# Patient Record
Sex: Male | Born: 1950 | Race: White | Hispanic: No | Smoking: Former smoker
Health system: Southern US, Community
[De-identification: ages and names within clinical notes are randomized; demographics above are authoritative.]

## PROBLEM LIST (undated history)

## (undated) DIAGNOSIS — J45909 Unspecified asthma, uncomplicated: Secondary | ICD-10-CM

## (undated) HISTORY — DX: Unspecified asthma, uncomplicated: J45.909

---

## 1988-08-27 HISTORY — PX: KNEE SURGERY: SHX244

## 2007-04-25 ENCOUNTER — Ambulatory Visit: Payer: Self-pay

## 2011-08-28 HISTORY — PX: HERNIA REPAIR: SHX51

## 2011-09-18 ENCOUNTER — Ambulatory Visit: Payer: Self-pay | Admitting: Surgery

## 2011-09-18 HISTORY — PX: INGUINAL HERNIA REPAIR: SUR1180

## 2014-12-19 NOTE — Op Note (Signed)
PATIENT NAME:  Tyler Gregory, Tyler Gregory MR#:  [id number] DATE OF BIRTH:  Oct 21, 1950  DATE OF PROCEDURE:  09/18/2011  PREOPERATIVE DIAGNOSIS: Left inguinal hernia.   POSTOPERATIVE DIAGNOSIS: Left inguinal hernia.   PROCEDURE: Left inguinal hernia repair.   SURGEON: Loreli Dollar, MD  ASSISTANT: Britta Mccreedy, PA  ANESTHESIA: General.   INDICATIONS: This 64 year old male has recently developed bulging in the left groin. Has noticed it for about three years but recently found that there was some pain associated with the bulging and did have findings of left inguinal hernia on exam. Repair was recommended for definitive treatment.   DESCRIPTION OF PROCEDURE: The patient was placed on the operating table in the supine position under general anesthesia. The abdomen was prepared with ChloraPrep, draped in a sterile manner.   A left lower quadrant transversely oriented suprapubic incision was made and carried down through subcutaneous tissues. Several small bleeding points were cauterized. The Scarpa's fascia was incised. The external ring was identified. The external oblique aponeurosis was incised along the course of its fibers to open the external ring and expose the inguinal cord structures. The ilioinguinal nerve was seen coursing medially and retracted back away from the repair site. The floor of the inguinal canal appeared to be intact. The cremaster muscle fibers were spread to expose an indirect hernia sac which was dissected free from surrounding structures up well into the internal ring. The sac was opened. Its continuity with the peritoneal cavity was demonstrated. A high ligation of the sac was done with a 0 Surgilon suture ligature. The sac was excised and the stump was allowed to retract. Next cord lipoma which was some 2 inches in length was dissected free from surrounding structures up into the internal ring and ligated with 4-0 chromic suture ligature and amputated. Did not appear that pathological  examination was needed. Next, the floor of the inguinal canal was reinforced with a row of 0 Surgilon sutures suturing the conjoined tendon to the shelving edge of the inguinal ligament with the last stitch leading to satisfactory narrowing of the internal ring. Next, an onlay Atrium mesh was placed to reinforce this. The mesh was cut out to an oval shape of some 2.5 x 4 cm in dimension. A notch cut out to straddle the internal ring and this was sewn to the repair and also medially attached to the fascia with 0 Surgilon sutures. The repair looked good. Hemostasis was intact. Cord structures and ilioinguinal nerve were replaced along the floor of the inguinal canal. The cut edges of the external oblique aponeurosis were closed with a running 4-0 Vicryl. The deep fascia superior and lateral to the repair site was infiltrated with 0.5% Sensorcaine with epinephrine. Subcutaneous tissues were infiltrated as well. Scarpa's fascia was closed with interrupted 4-0 Vicryl. The skin was closed with a running 4-0 Monocryl subcuticular suture and Dermabond.   The patient tolerated the procedure satisfactorily and is now being prepared for transfer to the recovery room.  ____________________________ Lenna Sciara. Rochel Brome, MD jws:cms D: 09/18/2011 10:00:35 ET T: 09/18/2011 10:20:28 ET JOB#: [id number]  cc: Loreli Dollar, MD, <Dictator> Loreli Dollar MD ELECTRONICALLY SIGNED 09/30/2011 15:18

## 2017-12-09 ENCOUNTER — Encounter: Payer: Self-pay | Admitting: Family Medicine

## 2017-12-09 ENCOUNTER — Ambulatory Visit: Payer: Self-pay | Admitting: Family Medicine

## 2017-12-09 VITALS — BP 122/82 | HR 78 | Temp 98.2°F | Resp 16 | Ht 73.0 in | Wt 226.4 lb

## 2017-12-09 DIAGNOSIS — J069 Acute upper respiratory infection, unspecified: Secondary | ICD-10-CM

## 2017-12-09 MED ORDER — HYDROCODONE-HOMATROPINE 5-1.5 MG/5ML PO SYRP: 5.0000 mL | ORAL_SOLUTION | Freq: Four times a day (QID) | ORAL | 0 refills | Status: AC | PRN

## 2017-12-09 NOTE — Progress Notes (Signed)
Subjective:     Patient ID: Tyler Gregory, male   DOB: [birth year], 67 y.o.   MRN: [id number] Chief Complaint  Patient presents with  . Cough    Patient comes in office today with complaints of cough and nasal congestion for one week, patient reports that cough seems to be worse at night. Patient has tried otc Robitussin Severe cold and Flonase.    HPI States he was exposed to an employee that was ill. Reports he has clear sinus drainage with PND and accompanying cough.  Review of Systems     Objective:   Physical Exam  Constitutional: He appears well-developed and well-nourished. No distress.  Ears: T.M's intact without inflammation Throat: tonsils absent Neck: no cervical adenopathy Lungs: clear     Assessment:    1. Viral upper respiratory tract infection - HYDROcodone-homatropine (HYCODAN) 5-1.5 MG/5ML syrup; Take 5 mLs by mouth every 6 (six) hours as needed for up to 5 days. 5 ml 4-6 hours as needed for cough  Dispense: 100 mL; Refill: 0    Plan:    Discussed use of Mucinex D.

## 2017-12-09 NOTE — Patient Instructions (Addendum)
Discussed use of Mucinex D. Call me if not improving over the course of the week.

## 2018-08-05 ENCOUNTER — Encounter: Payer: Self-pay | Admitting: Family Medicine

## 2018-08-05 ENCOUNTER — Ambulatory Visit (INDEPENDENT_AMBULATORY_CARE_PROVIDER_SITE_OTHER): Payer: Medicare Other | Admitting: Family Medicine

## 2018-08-05 ENCOUNTER — Other Ambulatory Visit: Payer: Self-pay

## 2018-08-05 VITALS — BP 138/78 | HR 79 | Temp 98.3°F | Ht 73.0 in | Wt 230.4 lb

## 2018-08-05 DIAGNOSIS — J029 Acute pharyngitis, unspecified: Secondary | ICD-10-CM

## 2018-08-05 LAB — POCT RAPID STREP A (OFFICE): Rapid Strep A Screen: NEGATIVE

## 2018-08-05 NOTE — Patient Instructions (Addendum)
Discussed warm salt water gargles, two Aleve twice daily with food and Benadryl at bedtime. Add Delsym for cough as needed.

## 2018-08-05 NOTE — Progress Notes (Signed)
  Subjective:     Patient ID: Tyler Gregory, male   DOB: 1950-10-23, 67 y.o.   MRN: [id number] Chief Complaint  Patient presents with  . Sore Throat    during day not bad and at night very bad and makes it hard for him to sleep  . Nasal Congestion    for 3 days 08/02/18   HPI Describes mild cold sx with increased sore throat at night. Has taken left over abx of unknown type for his sx.  Review of Systems     Objective:   Physical Exam  Constitutional: He appears well-developed and well-nourished. No distress.  Ears: T.M's intact without inflammation Throat: tonsils absent; uvula and posterior pharynx are inflamed. Neck: no cervical adenopathy Lungs: clear     Assessment:    1. Pharyngitis, unspecified etiology - POCT rapid strep A    Plan:    Discussed use of salt water gargles, Aleve, and Benadryl at night. Delsym if needed for cough.

## 2018-08-21 ENCOUNTER — Other Ambulatory Visit: Payer: Self-pay

## 2018-08-21 ENCOUNTER — Telehealth: Payer: Self-pay | Admitting: Family Medicine

## 2018-08-21 ENCOUNTER — Encounter: Payer: Self-pay | Admitting: Family Medicine

## 2018-08-21 ENCOUNTER — Ambulatory Visit (INDEPENDENT_AMBULATORY_CARE_PROVIDER_SITE_OTHER): Payer: Medicare Other | Admitting: Family Medicine

## 2018-08-21 VITALS — BP 130/74 | HR 86 | Temp 99.0°F | Ht 72.0 in | Wt 229.0 lb

## 2018-08-21 DIAGNOSIS — J069 Acute upper respiratory infection, unspecified: Secondary | ICD-10-CM | POA: Diagnosis not present

## 2018-08-21 DIAGNOSIS — Z8709 Personal history of other diseases of the respiratory system: Secondary | ICD-10-CM

## 2018-08-21 MED ORDER — ALBUTEROL SULFATE HFA 108 (90 BASE) MCG/ACT IN AERS: 2.0000 | INHALATION_SPRAY | Freq: Four times a day (QID) | RESPIRATORY_TRACT | 1 refills | Status: DC | PRN

## 2018-08-21 NOTE — Telephone Encounter (Signed)
He needs to be seen if he feels he is having an asthma flare. I am willing to put him in at 3:40 or he can report to an Urgent Care.

## 2018-08-21 NOTE — Telephone Encounter (Signed)
Spoke to pt and he advised that he has asthma and it flares up sometimes.  He was given a inhaler by dennis several years ago and needs another rx for ventolin sent to pharmacy if possible.

## 2018-08-21 NOTE — Progress Notes (Signed)
  Subjective:     Patient ID: Tyler Gregory, male   DOB: [birth year], 67 y.o.   MRN: [id number] Chief Complaint  Patient presents with  . asthma flare    think it has came from the bug spray he was using two days ago 08/19/18   HPI Reports cold sx onset two days ago coincident with bug spray use. Reports childhood hx of asthma.  Review of Systems     Objective:   Physical Exam Constitutional:      General: He is not in acute distress.    Appearance: Normal appearance. He is not ill-appearing.   Ears: T.M's intact without inflammation Throat: no tonsillar enlargement or exudate Neck: no cervical adenopathy Lungs: clear     Assessment:    1. URI, acute  2. History of asthma: rx for albuterol MDI    Plan:    Discussed use of Mucinex D for congestion, Delsym for cough, and Benadryl for postnasal drainage

## 2018-08-21 NOTE — Patient Instructions (Signed)
Discussed use of Mucinex D for congestion, Delsym for cough, and Benadryl for postnasal drainage 

## 2018-08-21 NOTE — Telephone Encounter (Signed)
Scheduled pt for appt at 3:40pm per Mikki Santee

## 2018-08-21 NOTE — Telephone Encounter (Signed)
Pt called saying he's having a asthma attack.  Please call him back at [phone]  To let him know if the Ventolin Inhaler can be called into:     [id number] [address], [place] [phone] (Phone) [phone] (Fax)   Was asked to speak to a nurse - declined.  Thanks, [employer]

## 2019-06-04 ENCOUNTER — Encounter: Payer: Self-pay | Admitting: Family Medicine

## 2019-06-04 ENCOUNTER — Ambulatory Visit
Admission: RE | Admit: 2019-06-04 | Discharge: 2019-06-04 | Disposition: A | Discharge status: Home / Self Care | Payer: PPO | Source: Ambulatory Visit | Attending: Family Medicine | Admitting: Family Medicine

## 2019-06-04 ENCOUNTER — Ambulatory Visit (INDEPENDENT_AMBULATORY_CARE_PROVIDER_SITE_OTHER): Payer: PPO | Admitting: Family Medicine

## 2019-06-04 ENCOUNTER — Other Ambulatory Visit: Payer: Self-pay

## 2019-06-04 VITALS — BP 120/68 | HR 85 | Temp 96.9°F | Wt 228.0 lb

## 2019-06-04 DIAGNOSIS — G8929 Other chronic pain: Secondary | ICD-10-CM

## 2019-06-04 DIAGNOSIS — Z23 Encounter for immunization: Secondary | ICD-10-CM

## 2019-06-04 DIAGNOSIS — M25511 Pain in right shoulder: Secondary | ICD-10-CM

## 2019-06-04 DIAGNOSIS — L814 Other melanin hyperpigmentation: Secondary | ICD-10-CM

## 2019-06-04 NOTE — Progress Notes (Signed)
Tyler Gregory  MRN: [id number] DOB: 11/21/1950  Subjective:  HPI  The patient is a 68 year old male who presents for evaluation of his right sided should blade pain. He states that it has been hurting him for about 1.5 months.  He has had a history of this same pain last November.  It got better but has again started back.   He has taken Ibuprofen for the pain but states that it makes him too drowsy the next day.  Lying down he does not feel as bad.  Standing is the worst.    The patient also would like to have a spot on his clavicle checked to make sure it is not cancer.  There are no active problems to display for this patient.   Past Medical History:  Diagnosis Date  . Asthma    Past Surgical History:  Procedure Laterality Date  . HERNIA REPAIR  2013  . KNEE SURGERY  1990   No family history on file.  Social History   Socioeconomic History  . Marital status: Unknown    Spouse name: Not on file  . Number of children: Not on file  . Years of education: Not on file  . Highest education level: Not on file  Occupational History  . Not on file  Social Needs  . Financial resource strain: Not on file  . Food insecurity    Worry: Not on file    Inability: Not on file  . Transportation needs    Medical: Not on file    Non-medical: Not on file  Tobacco Use  . Smoking status: Former Smoker    Types: Cigarettes  . Smokeless tobacco: Never Used  Substance and Sexual Activity  . Alcohol use: Yes    Comment: occational  . Drug use: Not Currently  . Sexual activity: Not on file  Lifestyle  . Physical activity    Days per week: Not on file    Minutes per session: Not on file  . Stress: Not on file  Relationships  . Social Herbalist on phone: Not on file    Gets together: Not on file    Attends religious service: Not on file    Active member of club or organization: Not on file    Attends meetings of clubs or organizations: Not on file    Relationship  status: Not on file  . Intimate partner violence    Fear of current or ex partner: Not on file    Emotionally abused: Not on file    Physically abused: Not on file    Forced sexual activity: Not on file  Other Topics Concern  . Not on file  Social History Narrative  . Not on file    Outpatient Encounter Medications as of 06/04/2019  Medication Sig  . albuterol (PROVENTIL HFA;VENTOLIN HFA) 108 (90 Base) MCG/ACT inhaler Inhale 2 puffs into the lungs every 6 (six) hours as needed for wheezing or shortness of breath.   No facility-administered encounter medications on file as of 06/04/2019.    Allergies  Allergen Reactions  . Shrimp [Shellfish Allergy] Swelling   Review of Systems  Constitutional: Negative for chills, diaphoresis, fever and malaise/fatigue.  HENT: Negative for congestion, ear pain and sore throat.   Respiratory: Negative for cough and shortness of breath.   Cardiovascular: Negative for chest pain.  Gastrointestinal: Negative for abdominal pain and diarrhea.  Musculoskeletal: Positive for back pain.  Neurological: Negative for headaches.  Objective:  BP 120/68 (BP Location: Right Arm, Patient Position: Sitting, Cuff Size: Normal)   Pulse 85   Temp (!) 96.9 F (36.1 C) (Skin)   Wt 228 lb (103.4 kg)   SpO2 95%   BMI 30.92 kg/m   Physical Exam  Constitutional: He is oriented to person, place, and time and well-developed, well-nourished, and in no distress.  HENT:  Head: Normocephalic.  Eyes: Conjunctivae are normal.  Neck: Neck supple.  Cardiovascular: Normal rate.  Pulmonary/Chest: Effort normal and breath sounds normal.  Abdominal: Soft.  Musculoskeletal: Normal range of motion.        General: Tenderness present.     Comments: Slight tenderness along the medial border of the right scapula to deep palpation. Good UE strength bilaterally. No numbness or loss of ROM.  Neurological: He is alert and oriented to person, place, and time.  Skin: No rash  noted.  Psychiatric: Mood, affect and judgment normal.    Assessment and Plan :  1. 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 Feels Ibuprofen causes morning drowsiness and stretching exercises not much help in keeping this from returning each day. Recommend Salonpas Lidocaine patch or Icy-Hot with Lidocaine cream to the area prn. Will get x-ray evaluation of shoulder and schedule physical therapy. - DG Shoulder Right - Ambulatory referral to Physical Therapy  2. Need for influenza vaccination - Flu Vaccine QUAD High Dose(Fluad)  3. Age spots 1.5-2.0 cm light tan spot over the right upper chest near clavicle. No tenderness or local lymphadenopathy. Suspect age spot versus early seborrheic keratosis. Advised patient of benign nature of this lesion.

## 2019-06-15 ENCOUNTER — Ambulatory Visit: Payer: PPO | Admitting: Physical Therapy

## 2019-06-17 ENCOUNTER — Encounter: Payer: Self-pay | Admitting: Physical Therapy

## 2019-06-17 ENCOUNTER — Encounter: Payer: PPO | Admitting: Physical Therapy

## 2019-06-17 ENCOUNTER — Ambulatory Visit: Payer: PPO | Attending: Family Medicine | Admitting: Physical Therapy

## 2019-06-17 ENCOUNTER — Other Ambulatory Visit: Payer: Self-pay

## 2019-06-17 DIAGNOSIS — M5413 Radiculopathy, cervicothoracic region: Secondary | ICD-10-CM | POA: Diagnosis not present

## 2019-06-17 DIAGNOSIS — M542 Cervicalgia: Secondary | ICD-10-CM | POA: Insufficient documentation

## 2019-06-17 DIAGNOSIS — M25611 Stiffness of right shoulder, not elsewhere classified: Secondary | ICD-10-CM | POA: Diagnosis not present

## 2019-06-17 NOTE — Therapy (Signed)
[facility] [address], [place], [address] Phone: [phone]   Fax:  [phone]  Physical Therapy Evaluation  Patient Details  Name: Tyler Gregory MRN: [id number] Date of Birth: 1951/04/11 No data recorded  Encounter Date: 06/17/2019  PT End of Session - 06/17/19 0816    Visit Number  1    Number of Visits  17    Date for PT Re-Evaluation  08/13/19    PT Start Time  0800    PT Stop Time  0900    PT Time Calculation (min)  60 min    Activity Tolerance  Patient tolerated treatment well    Behavior During Therapy  [facility] for tasks assessed/performed       Past Medical History:  Diagnosis Date  . Asthma     Past Surgical History:  Procedure Laterality Date  . HERNIA REPAIR  2013  . KNEE SURGERY  1990    There were no vitals filed for this visit.      OBJECTIVE  MUSCULOSKELETAL: Tremor: Normal Bulk: Normal Tone: Normal  Cervical Screen AROM: Ext/flex WNL Rotation R: 59d L 64d  Lateral bending 30 bilat Spurlings A (ipsilateral lateral flexion/axial compression): R: Positive  L: Negative Spurlings B (ipsilateral lateral flexion/contralateral rotation/axial compression): R: Positive L: Negative Repeated movement: No centralization or peripheralization with protraction or retraction Hoffman Sign (cervical cord compression): Negative bilat ULTT Median: Negative bilat ULTT Ulnar: Negative bilat ULTT Radial: Negative bilat  Elbow Screen Elbow AROM: WNL  Palpation Trigger points with associated pain at R mid trap fibers, rhomboids and levator. Latent trigger points and tension noted at R latissimus/teres minor, bilat pec minor, cervical/upper tspine paraspinals  Posture: increased thoracic kyphosis, forward head, rounded shoulders  Upper tspine hypomobile  Strength R/L 5/5 Shoulder flexion (anterior deltoid/pec major/coracobrachialis, axillary n. (C5-6) and musculocutaneous n.  (C5-7)) 5/5 Shoulder abduction (deltoid/supraspinatus, axillary/suprascapular n, C5) 5/5 Shoulder external rotation (infraspinatus/teres minor) 5/5 Shoulder internal rotation (subcapularis/lats/pec major) 5/5 Shoulder extension (posterior deltoid, lats, teres major, axillary/thoracodorsal n.) 5/5 Shoulder horizontal abduction 5/5 Elbow flexion (biceps brachii, brachialis, brachioradialis, musculoskeletal n, C5-6) 5/5 Elbow extension (triceps, radial n, C7) 5/5 Wrist Extension 5/5 Wrist Flexion 5/5 Finger adduction (interossei, ulnar n, T1)  AROM R/L 180/180 Shoulder flexion 180/180 Shoulder abduction C8 bilat Shoulder external rotation L1/T12 Shoulder internal rotation 60/60 Shoulder extension *Indicates pain, overpressure performed unless otherwise indicated  PROM R/L 180/180 Shoulder flexion 180/180 Shoulder abduction 90/90 Shoulder external rotation 70/60* Shoulder internal rotation 60/60 Shoulder extension *Indicates pain, overpressure performed unless otherwise indicated  Accessory Motions/Glides Glenohumeral: Posterior: Normal bilat Inferior:Normal bilat Anterior: Not examined  Acromioclavicular:  Posterior: Normal bilat Anterior: Not tested  Sternoclavicular: Posterior: Normal bilat Superior: Normal bilat Inferior: Normal bilat  Scapulothoracic: Distraction: difficult on R side d/t muscle tension  Medial: Normal bilat Lateral:  difficult on R side d/t muscle tension  Inferior: limited bilat Superior: Normal bilat   NEUROLOGICAL:  Mental Status Patient is oriented to person, place and time.  Recent memory is intact.  Remote memory is intact.  Attention span and concentration are intact.  Expressive speech is intact.  Patient's fund of knowledge is within normal limits for educational level.  Cranial Nerves Visual acuity and visual fields are intact  Extraocular muscles are intact  Facial sensation is intact bilaterally  Facial strength is  intact bilaterally  Hearing is normal as tested by gross conversation Palate elevates midline, normal phonation  Shoulder shrug strength is  intact  Tongue protrudes midline   Sensation Grossly intact to light touch bilateral UE as determined by testing dermatomes C2-T2 Proprioception and hot/cold testing deferred on this date    SPECIAL TESTS  Rotator Cuff  Drop Arm Test: Negative  Painful Arc (Pain from 60 to 120 degrees scaption): Negative Infraspinatus Muscle Test: Negative If all 3 tests positive, the probability of a full-thickness rotator cuff tear is 91%  Subacromial Impingement Hawkins-Kennedy: Negative Neer (Block scapula, PROM flexion): Negative Painful Arc (Pain from 60 to 120 degrees scaption): Negative Empty Can: Reports some "different pain" External Rotation Resistance: Negative Horizontal Adduction: Negative Scapular Assist: Negative  Labral Tear Biceps Load II (120 elevation, full ER, 90 elbow flexion, full supination, resisted elbow flexion): Negative Crank (160 scaption, axial load with IR/ER): Negative Active Compression Test: Negative  Bicep Tendon Pathology Speed (shoulder flexion to 90, external rotation, full elbow extension, and forearm supination with resistance: Negative Yergason's (resisted shoulder ER and supination/biceps tendon pathology): Negative  Shoulder Instability Sulcus Sign: Negative Anterior Apprehension: Negative  Outcome Measures Quick DASH:    Ther-Ex Education on corrections for therex patient is already completing ie doorway pec stretch,  Education for 30-60sec hold in position Scapular retractions x10 with min cuing to prevent shoulder hiking, good carry over Cervical retraction + upper cervical flex x10 with use of hands for TC for technique with good carry over Patient reports retraction feels good, advised 10x/hour  Education on ergonomics at work with towel roll at [employer] for postural cuing, and neural desk  posure Education on heat, trigger point massage with ball, and TDN as a" temporary fix" and restoration of length/tension relationships for prolonged pain relief, and increased function/longevity of function Some trigger point release, which patient reports decreases pain                          Objective measurements completed on examination: See above findings.                PT Short Term Goals - 06/17/19 1040      PT SHORT TERM GOAL #1   Title  Pt will be independent with HEP in order to improve strength and decrease pain in order to improve pain-free function at home and work.    Baseline  06/17/19 HEP given    Time  4    Period  Weeks    Status  New        PT Long Term Goals - 06/17/19 1041      PT LONG TERM GOAL #1   Title  Pt will decrease quick DASH score by at least 8% in order to demonstrate clinically significant reduction in disability.    Baseline  06/17/19 25%    Time  8    Period  Weeks    Status  New      PT LONG TERM GOAL #2   Title  Pt will decrease worst pain as reported on NPRS by at least 3 points in order to demonstrate clinically significant reduction in pain.    Baseline  06/17/19    Time  8    Period  Weeks    Status  New      PT LONG TERM GOAL #3   Title  Pt will increase periscapular strength of  by at least 1/2 MMT grade in order to demonstrate improvement in strength and function    Baseline  06/17/19 Y L 4- R  3+ ;T 4 bilat ; I 4+ bilat    Time  8    Period  Weeks    Status  New      PT LONG TERM GOAL #4   Title  Patient will demonstrate neutral sitting posture without cuing with 100% accuracy for improved function at work    Baseline  06/17/19 thoracic kyphosis, rounded shoulders, forward head, needs mod VC and TC for correction, able to comply 75%    Time  8    Period  Weeks    Status  New             Plan - 06/17/19 1129    Clinical Impression Statement  Patient is a 68 year old male with  signs and symptoms consistent with R sided cervical rediculopathy. Impairments in sensation, thoracic hypomobility, postural deficits, periscapular weakness, and soft tissue tension. 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 Would benefit from skilled PT to address above deficits and promote optimal return to PLOF.    Personal Factors and Comorbidities  Age;Comorbidity 1;Fitness;Profession    Comorbidities  asthma    Examination-Activity Limitations  Sit;Lift;Carry;Reach Overhead    Examination-Participation Restrictions  Community Activity;Driving   desk work   Stability/Clinical Decision Making  Evolving/Moderate complexity    Clinical Decision Making  Moderate    Rehab Potential  Good    PT Frequency  2x / week    PT Duration  8 weeks    PT Treatment/Interventions  ADLs/Self Care Home Management;Iontophoresis 4mg /ml Dexamethasone;Electrical Stimulation;Functional mobility training;Neuromuscular re-education;Taping;Spinal Manipulations;Joint Manipulations;Vestibular;Passive range of motion;Dry needling;Ultrasound;Traction;Cryotherapy;Moist Heat;Therapeutic exercise;Therapeutic activities;Patient/family education;Manual techniques    PT Next Visit Plan  HEP review, TDN, postural restoration    PT Home Exercise Plan  scapular retractions, cervical retractions, pec stretch, seated thoracic ext    Consulted and Agree with Plan of Care  Patient       Patient will benefit from skilled therapeutic intervention in order to improve the following deficits and impairments:  Decreased endurance, Decreased mobility, Hypomobility, Increased muscle spasms, Impaired sensation, Decreased activity tolerance, Decreased strength, Increased fascial restricitons, Impaired flexibility, Impaired UE functional use, Postural dysfunction, Pain, Improper body mechanics, Impaired tone, Decreased range of  motion  Visit Diagnosis: Cervicalgia  Radiculopathy, cervicothoracic region  Stiffness of right shoulder, not elsewhere classified     Problem List There are no active problems to display for this patient.  Shelton Silvas PT, DPT Shelton Silvas 06/17/2019, 11:46 AM  [facility] [address] [place], [address] Phone: [phone]   Fax:  [phone]  Name: Tyler Gregory MRN: [id number] Date of Birth: December 11, 1950

## 2019-06-22 ENCOUNTER — Ambulatory Visit: Payer: PPO | Admitting: Physical Therapy

## 2019-06-24 ENCOUNTER — Encounter: Payer: Self-pay | Admitting: Physical Therapy

## 2019-06-24 ENCOUNTER — Other Ambulatory Visit: Payer: Self-pay

## 2019-06-24 ENCOUNTER — Ambulatory Visit: Payer: PPO | Admitting: Physical Therapy

## 2019-06-24 DIAGNOSIS — M25611 Stiffness of right shoulder, not elsewhere classified: Secondary | ICD-10-CM

## 2019-06-24 DIAGNOSIS — M5413 Radiculopathy, cervicothoracic region: Secondary | ICD-10-CM

## 2019-06-24 DIAGNOSIS — M542 Cervicalgia: Secondary | ICD-10-CM

## 2019-06-24 NOTE — Therapy (Signed)
[facility] [address], [place], [address] Phone: [phone]   Fax:  [phone]  Physical Therapy Treatment  Patient Details  Name: Tyler Gregory MRN: [id number] Date of Birth: 13-Jun-1951 No data recorded  Encounter Date: 06/24/2019  PT End of Session - 06/24/19 0907    Visit Number  2    Number of Visits  17    Date for PT Re-Evaluation  08/13/19    PT Start Time  0900    PT Stop Time  0945    PT Time Calculation (min)  45 min    Activity Tolerance  Patient tolerated treatment well    Behavior During Therapy  [facility] for tasks assessed/performed       Past Medical History:  Diagnosis Date  . Asthma     Past Surgical History:  Procedure Laterality Date  . HERNIA REPAIR  2013  . KNEE SURGERY  1990    There were no vitals filed for this visit.  Subjective Assessment - 06/24/19 0901    Subjective  Patient reports his neck pain is 20% better. Worse numbness and tingling later in the day. He is sleeping better at night which he is happy with. Reports 3/10 pain today.    Pertinent History  Pt is 68 year old male with acute on chronic R posterior shoulder pain. Reports this episode has been going on a month and half. 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 Patient reports pain is a pins and needles sensation from post scapula to middle of the forearm, that is worse at night and with continuous movement. Pain is aggravated by overhead motion, driving his work truck, and it is worse at night. 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 Worst pain over the past week 6/10, best 0/10 when he wakes up in the morning.  Pt denies N/V, B&B changes, unexplained weight fluctuation, saddle paresthesia,  fever, night sweats, or unrelenting night pain at this time.    Limitations  House hold activities;Lifting    How long can you sit comfortably?  unimited    How long can you stand comfortably?  unlimited    How long can you walk comfortably?  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 Patient was educated on risks and benefits of therapy and verbally consents to PT.   Ther-Ex - Thoracic ext over 1/2 foam with BUE flex on theraball x12 with breath control, cuing needed for this good carry over - Seated with 1/2 foam band pull aparts black TB 3x 10 with cuing for cervical neutral, and scap retraction with decent carry over - Supine chin tuck with 1in lift x10sec hold x5 with min cuing needed initially for proper technique with good carry over following - Review on cervical retraction +  upper cervical flexion with min correction needed, encouragement to complete 10x every hour - Prone Y 3x 10 with cuing needed initially for scapular retraction with good carry over - Standing rows 20# x10 25# 2x 10 with cuing initially for maintained neutral cervical spine with good carry over                   PT Education - 06/24/19 0904    Education Details  TDN, therex form    Person(s) Educated  Patient    Methods  Explanation;Demonstration;Verbal cues    Comprehension  Verbalized understanding;Returned demonstration;Verbal cues required       PT Short Term Goals - 06/17/19 1040      PT SHORT TERM GOAL #1   Title  Pt will be independent with HEP in order to improve strength and decrease pain in order to improve  pain-free function at home and work.    Baseline  06/17/19 HEP given    Time  4    Period  Weeks    Status  New        PT Long Term Goals - 06/17/19 1041      PT LONG TERM GOAL #1   Title  Pt will decrease quick DASH score by at least 8% in order to demonstrate clinically significant reduction in disability.    Baseline  06/17/19 25%    Time  8    Period  Weeks    Status  New      PT LONG TERM GOAL #2   Title  Pt will decrease worst pain as reported on NPRS by at least 3 points in order to demonstrate clinically significant reduction in pain.    Baseline  06/17/19    Time  8    Period  Weeks    Status  New      PT LONG TERM GOAL #3   Title  Pt will increase periscapular strength of  by at least 1/2 MMT grade in order to demonstrate improvement in strength and function    Baseline  06/17/19 Y L 4- R 3+ ;T 4 bilat ; I 4+ bilat    Time  8    Period  Weeks    Status  New      PT LONG TERM GOAL #4   Title  Patient will demonstrate neutral sitting posture without cuing with 100% accuracy for improved function at work    Baseline  06/17/19 thoracic kyphosis, rounded shoulders, forward head, needs mod VC and TC for correction, able to comply 75%    Time  8    Period  Weeks    Status  New              Patient will benefit from skilled therapeutic intervention in order to improve the following deficits and impairments:     Visit Diagnosis: Cervicalgia  Radiculopathy, cervicothoracic region  Stiffness of right shoulder, not elsewhere classified     Problem List There are no active problems to display for this patient.  Shelton Silvas PT, DPT Shelton Silvas 06/24/2019, 9:08 AM  [place] [facility] [facility] [address], [place], [address] Phone: [phone]   Fax:  [phone]  Name: Tyler Gregory MRN: [id number] Date of Birth: 04/22/1951

## 2019-06-29 ENCOUNTER — Ambulatory Visit: Payer: PPO | Attending: Family Medicine | Admitting: Physical Therapy

## 2019-06-29 DIAGNOSIS — M542 Cervicalgia: Secondary | ICD-10-CM | POA: Insufficient documentation

## 2019-06-29 DIAGNOSIS — M5413 Radiculopathy, cervicothoracic region: Secondary | ICD-10-CM | POA: Insufficient documentation

## 2019-06-29 DIAGNOSIS — M25611 Stiffness of right shoulder, not elsewhere classified: Secondary | ICD-10-CM | POA: Insufficient documentation

## 2019-07-01 ENCOUNTER — Ambulatory Visit: Payer: PPO | Admitting: Physical Therapy

## 2019-07-06 ENCOUNTER — Ambulatory Visit: Payer: PPO | Admitting: Physical Therapy

## 2019-07-08 ENCOUNTER — Ambulatory Visit: Payer: PPO | Admitting: Physical Therapy

## 2019-07-13 ENCOUNTER — Ambulatory Visit: Payer: PPO | Admitting: Physical Therapy

## 2019-07-15 ENCOUNTER — Other Ambulatory Visit: Payer: Self-pay

## 2019-07-15 ENCOUNTER — Encounter: Payer: Self-pay | Admitting: Physical Therapy

## 2019-07-15 ENCOUNTER — Encounter: Payer: PPO | Admitting: Physical Therapy

## 2019-07-15 ENCOUNTER — Ambulatory Visit: Payer: PPO | Admitting: Physical Therapy

## 2019-07-15 DIAGNOSIS — M5413 Radiculopathy, cervicothoracic region: Secondary | ICD-10-CM

## 2019-07-15 DIAGNOSIS — M542 Cervicalgia: Secondary | ICD-10-CM

## 2019-07-15 DIAGNOSIS — M25611 Stiffness of right shoulder, not elsewhere classified: Secondary | ICD-10-CM | POA: Diagnosis not present

## 2019-07-15 NOTE — Therapy (Signed)
[place] [facility] [address] [place], [address] Phone: [phone]   Fax:  [phone]  Physical Therapy Treatment  Patient Details  Name: Tyler Gregory MRN: [id number] Date of Birth: Aug 05, 1951 No data recorded  Encounter Date: 07/15/2019  PT End of Session - 07/15/19 0849    Visit Number  3    Number of Visits  17    Date for PT Re-Evaluation  08/13/19    PT Start Time  0815    PT Stop Time  0900    PT Time Calculation (min)  45 min    Activity Tolerance  Patient tolerated treatment well    Behavior During Therapy  [facility] for tasks assessed/performed       Past Medical History:  Diagnosis Date  . Asthma     Past Surgical History:  Procedure Laterality Date  . HERNIA REPAIR  2013  . KNEE SURGERY  1990    There were no vitals filed for this visit.  Subjective Assessment - 07/15/19 0819    Subjective  Patient returns to PT after 3 week absence d/t having trouble at work. 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 Reports this episode has been going on a month and half. 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 Patient reports pain is a pins and needles sensation from post scapula to middle of the forearm, that is worse at night and with continuous movement. Pain is aggravated by overhead motion, driving his work truck, and it is worse at night. 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 Worst pain over the past week 6/10, best 0/10 when he wakes up in the morning.  Pt  denies N/V, B&B changes, unexplained weight fluctuation, saddle paresthesia, fever, night sweats, or unrelenting night pain at this time.    Limitations  House hold activities;Lifting    How long can you sit comfortably?  unimited    How long can you stand comfortably?  unlimited    How long can you walk comfortably?  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 Patient was educated on risks and benefits of therapy and verbally consents to PT.   Ther-Ex - Thoracic ext over 1/2 foam with BUE pec stretch (hands behind head) x12 with breath control, cuing needed for this good carry over - Cervical retractions x10 with scap retractions x10 with TC at back of head for true retraction w/ upper cervical flex with good carry over - Levator stretch 2x 30sec hold  - Standing rows 25# 3x 10 with cuing for neutral  cervical posture with difficulty maintaining this, adopting forward head posture with breaks between sets - Lat pulldown 25# x10 with TC and demo initially with good carry over following - Theraball rollouts (modified childs post) 10x 10sec                    PT Education - 07/15/19 0848    Education Details  HEP review, therx form    Person(s) Educated  Patient    Methods  Explanation;Demonstration;Tactile cues;Verbal cues    Comprehension  Verbalized understanding;Returned demonstration;Verbal cues required;Tactile cues required       PT Short Term Goals - 06/17/19 1040      PT SHORT TERM GOAL #1   Title  Pt will be independent with HEP in order to improve strength and decrease pain in order to  improve pain-free function at home and work.    Baseline  06/17/19 HEP given    Time  4    Period  Weeks    Status  New        PT Long Term Goals - 06/17/19 1041      PT LONG TERM GOAL #1   Title  Pt will decrease quick DASH score by at least 8% in order to demonstrate clinically significant reduction in disability.    Baseline  06/17/19 25%    Time  8    Period  Weeks    Status  New      PT LONG TERM GOAL #2   Title  Pt will decrease worst pain as reported on NPRS by at least 3 points in order to demonstrate clinically significant reduction in pain.    Baseline  06/17/19    Time  8    Period  Weeks    Status  New      PT LONG TERM GOAL #3   Title  Pt will increase periscapular strength of  by at least 1/2 MMT grade in order to demonstrate improvement in strength and function    Baseline  06/17/19 Y L 4- R 3+ ;T 4 bilat ; I 4+ bilat    Time  8    Period  Weeks    Status  New      PT LONG TERM GOAL #4   Title  Patient will demonstrate neutral sitting posture without cuing with 100% accuracy for improved function at work    Baseline  06/17/19 thoracic kyphosis, rounded shoulders, forward head, needs mod VC and TC for correction, able to comply 75%    Time  8    Period  Weeks    Status  New            Plan - 07/15/19 [id number]    Clinical Impression Statement  PT continued utilization of manual and TDN to reduce muscle tension and pain with good success. PT reviewed HEP with some updates given, good demo and verbalized understanding. PT led patient through therex for postural strengthening with good success, cuing needed with good compliance. Reports 2/10 pain at the end of session    Personal Factors and Comorbidities  Age;Comorbidity 1;Fitness;Profession    Comorbidities  asthma    Examination-Activity Limitations  Sit;Lift;Carry;Reach Overhead    Examination-Participation Restrictions  Community Activity;Driving    Stability/Clinical Decision Making  Evolving/Moderate  complexity    Clinical Decision Making  Moderate    Rehab Potential  Good    PT Frequency  2x / week    PT Duration  8 weeks  PT Treatment/Interventions  ADLs/Self Care Home Management;Iontophoresis 4mg /ml Dexamethasone;Electrical Stimulation;Functional mobility training;Neuromuscular re-education;Taping;Spinal Manipulations;Joint Manipulations;Vestibular;Passive range of motion;Dry needling;Ultrasound;Traction;Cryotherapy;Moist Heat;Therapeutic exercise;Therapeutic activities;Patient/family education;Manual techniques    PT Next Visit Plan  TDN, postural restoration    PT Home Exercise Plan  scapular retractions, cervical retractions, pec stretch, seated thoracic ext    Consulted and Agree with Plan of Care  Patient       Patient will benefit from skilled therapeutic intervention in order to improve the following deficits and impairments:  Decreased endurance, Decreased mobility, Hypomobility, Increased muscle spasms, Impaired sensation, Decreased activity tolerance, Decreased strength, Increased fascial restricitons, Impaired flexibility, Impaired UE functional use, Postural dysfunction, Pain, Improper body mechanics, Impaired tone, Decreased range of motion  Visit Diagnosis: Cervicalgia  Radiculopathy, cervicothoracic region  Stiffness of right shoulder, not elsewhere classified     Problem List There are no active problems to display for this patient.  Shelton Silvas PT, DPT Shelton Silvas 07/15/2019, 9:03 AM  [place] [facility] [address] [place], [address] Phone: [phone]   Fax:  [phone]  Name: Tyler Gregory MRN: [id number] Date of Birth: [birth year]

## 2019-07-20 ENCOUNTER — Ambulatory Visit: Payer: PPO | Admitting: Physical Therapy

## 2019-07-20 ENCOUNTER — Encounter: Payer: PPO | Admitting: Physical Therapy

## 2019-07-22 ENCOUNTER — Encounter: Payer: PPO | Admitting: Physical Therapy

## 2019-07-22 ENCOUNTER — Ambulatory Visit: Payer: PPO | Admitting: Physical Therapy

## 2019-07-22 ENCOUNTER — Other Ambulatory Visit: Payer: Self-pay

## 2019-07-22 ENCOUNTER — Encounter: Payer: Self-pay | Admitting: Physical Therapy

## 2019-07-22 DIAGNOSIS — M542 Cervicalgia: Secondary | ICD-10-CM

## 2019-07-22 DIAGNOSIS — M25611 Stiffness of right shoulder, not elsewhere classified: Secondary | ICD-10-CM

## 2019-07-22 DIAGNOSIS — M5413 Radiculopathy, cervicothoracic region: Secondary | ICD-10-CM

## 2019-07-22 NOTE — Therapy (Signed)
[place] [facility] [address] [place], [address] Phone: [phone]   Fax:  [phone]  Physical Therapy Treatment  Patient Details  Name: Tyler Gregory MRN: [id number] Date of Birth: 07-21-1951 No data recorded  Encounter Date: 07/22/2019  PT End of Session - 07/22/19 0830    Visit Number  4    Number of Visits  17    Date for PT Re-Evaluation  08/13/19    PT Start Time  0800    PT Stop Time  0812    PT Time Calculation (min)  12 min    Activity Tolerance  Patient tolerated treatment well    Behavior During Therapy  [facility] for tasks assessed/performed       Past Medical History:  Diagnosis Date  . Asthma     Past Surgical History:  Procedure Laterality Date  . HERNIA REPAIR  2013  . KNEE SURGERY  1990    There were no vitals filed for this visit.    Manual STM withtrigger point releaseto R mid trap fibers, rhomboids, and levator, with concordant sign Following:Dry Needling: (3)63mm .30needles placed along the R mid trap/rhomboids/levatorto decrease increased muscular spasms and trigger points with the patient positioned in supine. Patient was educated on risks and benefits of therapy and verbally consents to PT.   Verbal HEP review through TDN/manual with patient verbalizing understanding and importance of continuing.                           PT Education - 07/22/19 0811    Education Details  HEP review, TDN    Person(s) Educated  Patient    Methods  Explanation    Comprehension  Verbalized understanding       PT Short Term Goals - 06/17/19 1040      PT SHORT TERM GOAL #1   Title  Pt will be independent with HEP in order to improve strength and decrease pain in order to improve pain-free function at home and work.    Baseline  06/17/19 HEP given    Time  4    Period  Weeks    Status  New        PT Long Term Goals - 06/17/19 1041      PT LONG TERM GOAL  #1   Title  Pt will decrease quick DASH score by at least 8% in order to demonstrate clinically significant reduction in disability.    Baseline  06/17/19 25%    Time  8    Period  Weeks    Status  New      PT LONG TERM GOAL #2   Title  Pt will decrease worst pain as reported on NPRS by at least 3 points in order to demonstrate clinically significant reduction in pain.    Baseline  06/17/19    Time  8    Period  Weeks    Status  New      PT LONG TERM GOAL #3   Title  Pt will increase periscapular strength of  by at least 1/2 MMT grade in order to demonstrate improvement in strength and function    Baseline  06/17/19 Y L 4- R 3+ ;T 4 bilat ; I 4+ bilat    Time  8    Period  Weeks    Status  New      PT LONG TERM GOAL #4  Title  Patient will demonstrate neutral sitting posture without cuing with 100% accuracy for improved function at work    Baseline  06/17/19 thoracic kyphosis, rounded shoulders, forward head, needs mod VC and TC for correction, able to comply 75%    Time  8    Period  Weeks    Status  New            Plan - 07/22/19 0830    Clinical Impression Statement  Patient reports he has to leave session d/t work issues. PT performed manual with TDN techniques to relieve soft tissue tension with good success, and reviewed importance of HEP, especially after prolonged absence from PT d/t the holiday. PT will continue progression as able.    Personal Factors and Comorbidities  Age;Comorbidity 1;Fitness;Profession    Comorbidities  asthma    Examination-Activity Limitations  Sit;Lift;Carry;Reach Overhead    Examination-Participation Restrictions  Community Activity;Driving    Stability/Clinical Decision Making  Evolving/Moderate complexity    Clinical Decision Making  Moderate    Rehab Potential  Good    PT Frequency  2x / week    PT Duration  8 weeks    PT Treatment/Interventions  ADLs/Self Care Home Management;Iontophoresis 4mg /ml Dexamethasone;Electrical  Stimulation;Functional mobility training;Neuromuscular re-education;Taping;Spinal Manipulations;Joint Manipulations;Vestibular;Passive range of motion;Dry needling;Ultrasound;Traction;Cryotherapy;Moist Heat;Therapeutic exercise;Therapeutic activities;Patient/family education;Manual techniques    PT Next Visit Plan  TDN, postural restoration    PT Home Exercise Plan  scapular retractions, cervical retractions, pec stretch, seated thoracic ext    Consulted and Agree with Plan of Care  Patient       Patient will benefit from skilled therapeutic intervention in order to improve the following deficits and impairments:  Decreased endurance, Decreased mobility, Hypomobility, Increased muscle spasms, Impaired sensation, Decreased activity tolerance, Decreased strength, Increased fascial restricitons, Impaired flexibility, Impaired UE functional use, Postural dysfunction, Pain, Improper body mechanics, Impaired tone, Decreased range of motion  Visit Diagnosis: Cervicalgia  Radiculopathy, cervicothoracic region  Stiffness of right shoulder, not elsewhere classified     Problem List There are no active problems to display for this patient.  Shelton Silvas PT, DPT Shelton Silvas 07/22/2019, 8:36 AM  [place] [facility] [address], [place], [address] Phone: [phone]   Fax:  [phone]  Name: Tyler Gregory MRN: [id number] Date of Birth: 09/21/1950

## 2019-07-27 ENCOUNTER — Encounter: Payer: PPO | Admitting: Physical Therapy

## 2019-07-27 ENCOUNTER — Ambulatory Visit: Payer: PPO | Admitting: Physical Therapy

## 2019-07-29 ENCOUNTER — Other Ambulatory Visit: Payer: Self-pay

## 2019-07-29 ENCOUNTER — Encounter: Payer: PPO | Admitting: Physical Therapy

## 2019-07-29 ENCOUNTER — Ambulatory Visit: Payer: PPO | Attending: Family Medicine | Admitting: Physical Therapy

## 2019-07-29 ENCOUNTER — Encounter: Payer: Self-pay | Admitting: Physical Therapy

## 2019-07-29 DIAGNOSIS — M542 Cervicalgia: Secondary | ICD-10-CM | POA: Diagnosis not present

## 2019-07-29 DIAGNOSIS — M25611 Stiffness of right shoulder, not elsewhere classified: Secondary | ICD-10-CM | POA: Diagnosis not present

## 2019-07-29 DIAGNOSIS — M5413 Radiculopathy, cervicothoracic region: Secondary | ICD-10-CM | POA: Diagnosis not present

## 2019-07-29 NOTE — Therapy (Signed)
[facility] [address] [place], [address] Phone: [phone]   Fax:  [phone]  Physical Therapy Treatment  Patient Details  Name: Tyler Gregory MRN: [id number] Date of Birth: [birth year] No data recorded  Encounter Date: 07/29/2019  PT End of Session - 07/29/19 0902    Visit Number  5    Number of Visits  17    Date for PT Re-Evaluation  08/13/19    PT Start Time  0847    PT Stop Time  0930    PT Time Calculation (min)  43 min    Activity Tolerance  Patient tolerated treatment well    Behavior During Therapy  [facility] for tasks assessed/performed       Past Medical History:  Diagnosis Date  . Asthma     Past Surgical History:  Procedure Laterality Date  . HERNIA REPAIR  2013  . KNEE SURGERY  1990    There were no vitals filed for this visit.  Subjective Assessment - 07/29/19 0852    Subjective  Reports he felt better after TDN last session, reports 3/10 pain today. Reports he wants to start going to the gym to work on his abdominal and back strength    Pertinent History  Pt is 67 year old male with acute on chronic R posterior shoulder pain. Reports this episode has been going on a month and half. 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 Patient reports pain is a pins and needles sensation from post scapula to middle of the forearm, that is worse at night and with continuous movement. Pain is aggravated by overhead motion, driving his work truck, and it is worse at night. 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 Worst pain over the past week 6/10, best 0/10 when he wakes up in the morning.  Pt denies N/V, B&B changes, unexplained weight fluctuation, saddle paresthesia, fever, night  sweats, or unrelenting night pain at this time.    Limitations  House hold activities;Lifting    How long can you sit comfortably?  unimited    How long can you stand comfortably?  unlimited    How long can you walk comfortably?  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 Patient was educated on risks and benefits of therapy and verbally consents to PT.   Ther-Ex - Standing rows 25# 3x 10 with better carry over cervical neutral, continues to need cuing - Bent over row 20# 3x 10 with cuing initially for posture with good carry over  - Hip hinge x10 with demo and max cuing initially for proper technique with good carry over following - Straight leg deadlift 3x 10 10# DB bilat with min cuing for posture with good carry over - Lat pulldown 25# x10 with TC and demo initially with good carry over following - Theraball rollouts (modified  childs post) 10x 10sec                          PT Education - 07/29/19 0900    Education Details  TDN, therex form    Person(s) Educated  Patient    Methods  Explanation;Demonstration;Tactile cues;Verbal cues    Comprehension  Verbalized understanding;Returned demonstration;Verbal cues required;Tactile cues required       PT Short Term Goals - 06/17/19 1040      PT SHORT TERM GOAL #1   Title  Pt will be independent with HEP in order to improve strength and decrease pain in order to improve pain-free function at home and work.    Baseline  06/17/19 HEP given    Time  4    Period  Weeks    Status  New        PT Long Term Goals - 06/17/19 1041      PT  LONG TERM GOAL #1   Title  Pt will decrease quick DASH score by at least 8% in order to demonstrate clinically significant reduction in disability.    Baseline  06/17/19 25%    Time  8    Period  Weeks    Status  New      PT LONG TERM GOAL #2   Title  Pt will decrease worst pain as reported on NPRS by at least 3 points in order to demonstrate clinically significant reduction in pain.    Baseline  06/17/19    Time  8    Period  Weeks    Status  New      PT LONG TERM GOAL #3   Title  Pt will increase periscapular strength of  by at least 1/2 MMT grade in order to demonstrate improvement in strength and function    Baseline  06/17/19 Y L 4- R 3+ ;T 4 bilat ; I 4+ bilat    Time  8    Period  Weeks    Status  New      PT LONG TERM GOAL #4   Title  Patient will demonstrate neutral sitting posture without cuing with 100% accuracy for improved function at work    Baseline  06/17/19 thoracic kyphosis, rounded shoulders, forward head, needs mod VC and TC for correction, able to comply 75%    Time  8    Period  Weeks    Status  New            Plan - 07/29/19 [id number]    Clinical Impression Statement  PT continued therex progression for increased trunk/cervical stability with good success. Patient is able to complete all therex with good carry over of cuing/correction, without pain. PT continued utilization of manual techniques with good pain response (no pain following) allowing for therex progression.    Personal Factors and Comorbidities  Age;Comorbidity 1;Fitness;Profession    Examination-Activity Limitations  Sit;Lift;Carry;Reach Overhead    Examination-Participation Restrictions  Community Activity;Driving    Stability/Clinical Decision Making  Evolving/Moderate complexity    Clinical Decision Making  Moderate    Rehab Potential  Good    PT Frequency  2x / week    PT Duration  8 weeks    PT Treatment/Interventions  ADLs/Self Care Home Management;Iontophoresis 4mg /ml  Dexamethasone;Electrical Stimulation;Functional mobility training;Neuromuscular re-education;Taping;Spinal Manipulations;Joint Manipulations;Vestibular;Passive range of motion;Dry needling;Ultrasound;Traction;Cryotherapy;Moist Heat;Therapeutic exercise;Therapeutic activities;Patient/family education;Manual techniques    PT Next Visit Plan  TDN, postural restoration    PT Home  Exercise Plan  scapular retractions, cervical retractions, pec stretch, seated thoracic ext    Consulted and Agree with Plan of Care  Patient       Patient will benefit from skilled therapeutic intervention in order to improve the following deficits and impairments:  Decreased endurance, Decreased mobility, Hypomobility, Increased muscle spasms, Impaired sensation, Decreased activity tolerance, Decreased strength, Increased fascial restricitons, Impaired flexibility, Impaired UE functional use, Postural dysfunction, Pain, Improper body mechanics, Impaired tone, Decreased range of motion  Visit Diagnosis: Cervicalgia  Radiculopathy, cervicothoracic region  Stiffness of right shoulder, not elsewhere classified     Problem List There are no active problems to display for this patient.  Shelton Silvas PT, DPT Shelton Silvas 07/29/2019, 9:23 AM  [facility] [address], [place], [address] Phone: [phone]   Fax:  [phone]  Name: Tyler Gregory MRN: [id number] Date of Birth: 1951/07/13

## 2019-08-03 ENCOUNTER — Encounter: Payer: PPO | Admitting: Physical Therapy

## 2019-08-05 ENCOUNTER — Encounter: Payer: PPO | Admitting: Physical Therapy

## 2019-08-06 ENCOUNTER — Encounter: Payer: Self-pay | Admitting: Physical Therapy

## 2019-08-06 ENCOUNTER — Ambulatory Visit: Payer: PPO | Admitting: Physical Therapy

## 2019-08-06 ENCOUNTER — Other Ambulatory Visit: Payer: Self-pay

## 2019-08-06 DIAGNOSIS — M25611 Stiffness of right shoulder, not elsewhere classified: Secondary | ICD-10-CM

## 2019-08-06 DIAGNOSIS — M5413 Radiculopathy, cervicothoracic region: Secondary | ICD-10-CM

## 2019-08-06 DIAGNOSIS — M542 Cervicalgia: Secondary | ICD-10-CM

## 2019-08-06 NOTE — Therapy (Signed)
[facility] [address], [place], [address] Phone: [phone]   Fax:  [phone]  Physical Therapy Treatment  Patient Details  Name: Tyler Gregory MRN: [id number] Date of Birth: [birth year] No data recorded  Encounter Date: 08/06/2019  PT End of Session - 08/06/19 [id number]    Visit Number  6    Number of Visits  17    Date for PT Re-Evaluation  08/13/19    PT Start Time  0904    PT Stop Time  0917    PT Time Calculation (min)  13 min    Activity Tolerance  Patient tolerated treatment well    Behavior During Therapy  [facility] for tasks assessed/performed       Past Medical History:  Diagnosis Date  . Asthma     Past Surgical History:  Procedure Laterality Date  . HERNIA REPAIR  2013  . KNEE SURGERY  1990    There were no vitals filed for this visit.  Subjective Assessment - 08/06/19 0905    Subjective  Patient reports he is on a time crunch this am and only has time for manual techniques. Patient reports the exercises are helping, but his pain is 3/10 d/t haivng to work more d/t holiday season    Pertinent History  Pt is 68 year old male with acute on chronic R posterior shoulder pain. Reports this episode has been going on a month and half. 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 Patient reports pain is a pins and needles sensation from post scapula to middle of the forearm, that is worse at night and with continuous movement. Pain is aggravated by overhead motion, driving his work truck, and it is worse at night. 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 Worst pain over the past week 6/10, best 0/10 when he wakes up in the morning.  Pt denies N/V, B&B changes, unexplained weight  fluctuation, saddle paresthesia, fever, night sweats, or unrelenting night pain at this time.    Limitations  House hold activities;Lifting    How long can you sit comfortably?  unimited    How long can you stand comfortably?  unlimited    How long can you walk comfortably?  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 Patient was educated on risks and benefits of therapy and verbally consents to PT.                           PT Education - 08/06/19 [id number]    Education Details  TDN    Person(s) Educated  Patient    Methods  Explanation    Comprehension  Verbalized understanding       PT Short Term Goals - 06/17/19 1040      PT SHORT TERM GOAL #1   Title  Pt will be independent with HEP in order to improve strength and decrease pain in order to improve pain-free function at home and work.    Baseline  06/17/19 HEP given    Time  4    Period  Weeks    Status  New        PT Long Term Goals - 06/17/19 1041      PT LONG TERM GOAL #1   Title  Pt will decrease quick DASH score by at least 8% in order to demonstrate clinically significant reduction in disability.    Baseline  06/17/19 25%    Time  8    Period  Weeks    Status  New      PT LONG TERM GOAL #2   Title  Pt will decrease worst pain as reported on NPRS by at least 3 points in order to demonstrate clinically significant reduction in pain.    Baseline  06/17/19    Time  8    Period  Weeks    Status  New      PT LONG TERM GOAL #3   Title  Pt will increase periscapular strength of  by at least 1/2 MMT grade in order to demonstrate  improvement in strength and function    Baseline  06/17/19 Y L 4- R 3+ ;T 4 bilat ; I 4+ bilat    Time  8    Period  Weeks    Status  New      PT LONG TERM GOAL #4   Title  Patient will demonstrate neutral sitting posture without cuing with 100% accuracy for improved function at work    Baseline  06/17/19 thoracic kyphosis, rounded shoulders, forward head, needs mod VC and TC for correction, able to comply 75%    Time  8    Period  Weeks    Status  New            Plan - 08/06/19 0930    Clinical Impression Statement  PT utilized manual and TDN for pain modulation and to decrease muscle tension with good response, local twitch response. Session shortened d/t patient time constraints, though patient reports he can follow up his HEP on his own to maintain gains made. PT will continue progression as able, with postural strengthening as patient schedule allows.    Personal Factors and Comorbidities  Age;Comorbidity 1;Fitness;Profession    Comorbidities  asthma    Examination-Activity Limitations  Sit;Lift;Carry;Reach Overhead    Examination-Participation Restrictions  Community Activity;Driving    Stability/Clinical Decision Making  Evolving/Moderate complexity    Clinical Decision Making  Moderate    Rehab Potential  Good    PT Frequency  2x / week    PT Duration  8 weeks    PT Treatment/Interventions  ADLs/Self Care Home Management;Iontophoresis 4mg /ml Dexamethasone;Electrical Stimulation;Functional mobility training;Neuromuscular re-education;Taping;Spinal Manipulations;Joint Manipulations;Vestibular;Passive range of motion;Dry needling;Ultrasound;Traction;Cryotherapy;Moist Heat;Therapeutic exercise;Therapeutic activities;Patient/family education;Manual techniques    PT Next Visit Plan  TDN, postural restoration    PT Home Exercise Plan  scapular retractions, cervical retractions, pec stretch, seated thoracic ext    Consulted and Agree with Plan of Care  Patient       Patient will  benefit from skilled therapeutic intervention in order to improve the following deficits and impairments:  Decreased endurance, Decreased mobility, Hypomobility, Increased muscle spasms, Impaired sensation, Decreased activity tolerance, Decreased strength, Increased fascial restricitons, Impaired flexibility, Impaired UE functional use, Postural dysfunction, Pain, Improper body mechanics, Impaired tone, Decreased  range of motion  Visit Diagnosis: Cervicalgia  Radiculopathy, cervicothoracic region  Stiffness of right shoulder, not elsewhere classified     Problem List There are no problems to display for this patient.  Shelton Silvas PT, DPT Shelton Silvas 08/06/2019, 9:43 AM  [facility] [address], [place], [address] Phone: [phone]   Fax:  [phone]  Name: Tyler Gregory MRN: [id number] Date of Birth: 11/10/1950

## 2019-08-10 ENCOUNTER — Ambulatory Visit: Payer: PPO | Admitting: Physical Therapy

## 2019-08-12 ENCOUNTER — Ambulatory Visit: Payer: PPO | Admitting: Physical Therapy

## 2019-08-17 ENCOUNTER — Ambulatory Visit: Payer: PPO | Admitting: Physical Therapy

## 2019-08-18 ENCOUNTER — Encounter: Payer: PPO | Admitting: Physical Therapy

## 2019-08-19 ENCOUNTER — Ambulatory Visit: Payer: PPO | Admitting: Physical Therapy

## 2019-08-24 ENCOUNTER — Ambulatory Visit: Payer: PPO | Admitting: Physical Therapy

## 2019-08-26 ENCOUNTER — Encounter: Payer: PPO | Admitting: Physical Therapy

## 2019-12-14 NOTE — Progress Notes (Signed)
Established patient visit    Patient: Tyler Gregory   DOB: [birth year]   70 y.o. Male  MRN: [id number] Visit Date: 12/17/2019  Today's healthcare provider: Vernie Murders, PA   Chief Complaint  Patient presents with  . Abdominal Pain   Subjective    HPI Abdominal Pain  He reports new onset abdominal pain. The most recent episode started a few days ago and is gradually improving. The abdominal pain is located in the right upper quadrant and left lower quadrant and does not radiate. It is described as sharp, is mild in intensity, occurring intermittently. It is aggravated by nothing and is relieved by eating. He has tried nothing to try to relieve pain with no relief.  Associated symptoms: No anorexia   No belching   No bloody stool  No blood in urine  No constipation  No diarrhea No dysuria No fever No flatus No headaches No joint pains No myalgias No nausea No vomiting No weight loss    Previous labs No results found for: WBC, HGB, HCT, MCV, MCH, RDW, PLT No results found for: GLUCOSE, NA, K, CL, CO2, BUN, CREATININE, GFRNONAA, GFRAA, CALCIUM, PHOS, PROT, ALBUMIN, LABGLOB, AGRATIO, BILITOT, ALKPHOS, AST, ALT, ANIONGAP No results found for: AMYLASE -----------------------------------------------------------------------------------------   Past Medical History:  Diagnosis Date  . Asthma    Past Surgical History:  Procedure Laterality Date  . HERNIA REPAIR  2013  . KNEE SURGERY  1990   Social History   Socioeconomic History  . Marital status: Unknown    Spouse name: Not on file  . Number of children: Not on file  . Years of education: Not on file  . Highest education level: Not on file  Occupational History  . Not on file  Tobacco Use  . Smoking status: Former Smoker    Types: Cigarettes  . Smokeless tobacco: Never Used  Substance and Sexual Activity  . Alcohol use: Yes    Comment: occational  . Drug use: Not Currently  . Sexual activity: Not on  file  Other Topics Concern  . Not on file  Social History Narrative  . Not on file   Social Determinants of Health   Financial Resource Strain:   . Difficulty of Paying Living Expenses:   Food Insecurity:   . Worried About Charity fundraiser in the Last Year:   . Arboriculturist in the Last Year:   Transportation Needs:   . Film/video editor (Medical):   Marland Kitchen Lack of Transportation (Non-Medical):   Physical Activity:   . Days of Exercise per Week:   . Minutes of Exercise per Session:   Stress:   . Feeling of Stress :   Social Connections:   . Frequency of Communication with Friends and Family:   . Frequency of Social Gatherings with Friends and Family:   . Attends Religious Services:   . Active Member of Clubs or Organizations:   . Attends Archivist Meetings:   Marland Kitchen Marital Status:   Intimate Partner Violence:   . Fear of Current or Ex-Partner:   . Emotionally Abused:   Marland Kitchen Physically Abused:   . Sexually Abused:    History reviewed. No pertinent family history. Allergies  Allergen Reactions  . Shrimp [Shellfish Allergy] Swelling       Medications: Outpatient Medications Prior to Visit  Medication Sig  . albuterol (PROVENTIL HFA;VENTOLIN HFA) 108 (90 Base) MCG/ACT inhaler Inhale 2 puffs into the lungs every 6 (six)  hours as needed for wheezing or shortness of breath.   No facility-administered medications prior to visit.    Review of Systems  Constitutional: Positive for fatigue.  HENT: Positive for congestion.   Respiratory: Negative.   Cardiovascular: Negative.   Gastrointestinal: Positive for abdominal pain.  Musculoskeletal: Negative.        Objective    BP 130/82 (BP Location: Right Arm, Patient Position: Sitting, Cuff Size: Large)   Pulse 93   Temp (!) 97.3 F (36.3 C) (Temporal)   Ht 6' (1.829 m)   Wt 227 lb (103 kg)   BMI 30.79 kg/m    Physical Exam Constitutional:      Appearance: He is well-developed and normal weight.    HENT:     Head: Normocephalic and atraumatic.     Mouth/Throat:     Comments: Throat irritation  Eyes:     Pupils: Pupils are equal, round, and reactive to light.  Cardiovascular:     Rate and Rhythm: Normal rate and regular rhythm.     Pulses: Normal pulses.     Heart sounds: Normal heart sounds.  Pulmonary:     Effort: Pulmonary effort is normal.     Breath sounds: Normal breath sounds.  Abdominal:     General: Bowel sounds are normal. There is no distension.     Palpations: Abdomen is soft.     Tenderness: There is no abdominal tenderness. There is no right CVA tenderness or left CVA tenderness.  Musculoskeletal:        General: Normal range of motion.  Skin:    General: Skin is warm and dry.  Neurological:     General: No focal deficit present.     Mental Status: He is alert and oriented to person, place, and time.  Psychiatric:        Mood and Affect: Mood normal.        Behavior: Behavior normal.        Thought Content: Thought content normal.        Judgment: Judgment normal.        Assessment & Plan    1. Abdominal pain, unspecified abdominal location Have labs done. Will call with results. Developed a short lived abdominal pain in the RUQ and LLQ 5 days ago. No association with food. No gas, diarrhea, hematochezia, constipation, dyspepsia, nausea or vomiting. States he had episodes of heavy ET)H intake in the past but none recently.  No pain today and eating normally.   - CBC with Differential - Comprehensive Metabolic Panel (CMET) - TSH - Lipid Profile - Lipase - Amylase   Return if symptoms worsen or fail to improve.      Andres Shad, PA, have reviewed all documentation for this visit. The documentation on 12/17/19 for the exam, diagnosis, procedures, and orders are all accurate and complete.    Vernie Murders, [place] [phone] (phone) [phone] (fax)  [place]

## 2019-12-17 ENCOUNTER — Encounter: Payer: Self-pay | Admitting: Family Medicine

## 2019-12-17 ENCOUNTER — Other Ambulatory Visit: Payer: Self-pay

## 2019-12-17 ENCOUNTER — Ambulatory Visit (INDEPENDENT_AMBULATORY_CARE_PROVIDER_SITE_OTHER): Payer: PPO | Admitting: Family Medicine

## 2019-12-17 VITALS — BP 130/82 | HR 93 | Temp 97.3°F | Ht 72.0 in | Wt 227.0 lb

## 2019-12-17 DIAGNOSIS — R109 Unspecified abdominal pain: Secondary | ICD-10-CM

## 2019-12-18 ENCOUNTER — Telehealth: Payer: Self-pay | Admitting: *Deleted

## 2019-12-18 LAB — CBC WITH DIFFERENTIAL/PLATELET
Basophils Absolute: 0 10*3/uL (ref 0.0–0.2)
Basos: 0 %
EOS (ABSOLUTE): 0.2 10*3/uL (ref 0.0–0.4)
Eos: 3 %
Hematocrit: 44.8 % (ref 37.5–51.0)
Hemoglobin: 15.9 g/dL (ref 13.0–17.7)
Immature Grans (Abs): 0 10*3/uL (ref 0.0–0.1)
Immature Granulocytes: 0 %
Lymphocytes Absolute: 1.4 10*3/uL (ref 0.7–3.1)
Lymphs: 23 %
MCH: 31.2 pg (ref 26.6–33.0)
MCHC: 35.5 g/dL (ref 31.5–35.7)
MCV: 88 fL (ref 79–97)
Monocytes Absolute: 0.5 10*3/uL (ref 0.1–0.9)
Monocytes: 8 %
Neutrophils Absolute: 4.2 10*3/uL (ref 1.4–7.0)
Neutrophils: 66 %
Platelets: 200 10*3/uL (ref 150–450)
RBC: 5.1 x10E6/uL (ref 4.14–5.80)
RDW: 12.7 % (ref 11.6–15.4)
WBC: 6.3 10*3/uL (ref 3.4–10.8)

## 2019-12-18 LAB — COMPREHENSIVE METABOLIC PANEL
ALT: 30 IU/L (ref 0–44)
AST: 22 IU/L (ref 0–40)
Albumin/Globulin Ratio: 1.8 (ref 1.2–2.2)
Albumin: 4.6 g/dL (ref 3.8–4.8)
Alkaline Phosphatase: 59 IU/L (ref 39–117)
BUN/Creatinine Ratio: 17 (ref 10–24)
BUN: 15 mg/dL (ref 8–27)
Bilirubin Total: 0.5 mg/dL (ref 0.0–1.2)
CO2: 23 mmol/L (ref 20–29)
Calcium: 9.5 mg/dL (ref 8.6–10.2)
Chloride: 103 mmol/L (ref 96–106)
Creatinine, Ser: 0.89 mg/dL (ref 0.76–1.27)
GFR calc Af Amer: 102 mL/min/{1.73_m2} (ref >59)
GFR calc non Af Amer: 88 mL/min/{1.73_m2} (ref >59)
Globulin, Total: 2.5 g/dL (ref 1.5–4.5)
Glucose: 133 mg/dL — ABNORMAL HIGH (ref 65–99)
Potassium: 4 mmol/L (ref 3.5–5.2)
Sodium: 140 mmol/L (ref 134–144)
Total Protein: 7.1 g/dL (ref 6.0–8.5)

## 2019-12-18 LAB — LIPID PANEL
Chol/HDL Ratio: 4.3 ratio (ref 0.0–5.0)
Cholesterol, Total: 173 mg/dL (ref 100–199)
HDL: 40 mg/dL (ref >39)
LDL Chol Calc (NIH): 76 mg/dL (ref 0–99)
Triglycerides: 355 mg/dL — ABNORMAL HIGH (ref 0–149)
VLDL Cholesterol Cal: 57 mg/dL — ABNORMAL HIGH (ref 5–40)

## 2019-12-18 LAB — TSH: TSH: 2.06 u[IU]/mL (ref 0.450–4.500)

## 2019-12-18 LAB — AMYLASE: Amylase: 94 U/L (ref 31–110)

## 2019-12-18 LAB — LIPASE: Lipase: 62 U/L (ref 13–78)

## 2019-12-18 NOTE — Telephone Encounter (Signed)
Patient notified of lab results and PCP recommendations. He will come back in 3 months for recheck.

## 2019-12-18 NOTE — Telephone Encounter (Signed)
Tried calling pt, no answer and no vm. Patient needs in office follow up in 3 months. Okay for [facility] triage to give patient results.

## 2019-12-18 NOTE — Telephone Encounter (Signed)
-----   Message from Margo Common, [place] sent at 12/18/2019  8:55 AM EDT ----- All blood tests essentially normal except triglycerides and glucose high for fasting levels. Lower fats and sugars in diet should help this along with regular exercise (walking 30-40 minutes 3-4 days a week is a good way to start). Recheck levels in 3 months to be sure no developing disease.

## 2020-02-18 IMAGING — CR DG SHOULDER 2+V*R*
1 series · 3 of 3 positions shown · non-contrast
Comparison: None.

CLINICAL DATA: Chronic right shoulder pain without known injury.

EXAM:
RIGHT SHOULDER - 2+ VIEW

[Series 1: dg shoulder right · 0.14mm/px · 3 of 3 slices shown]
[im 1/3]
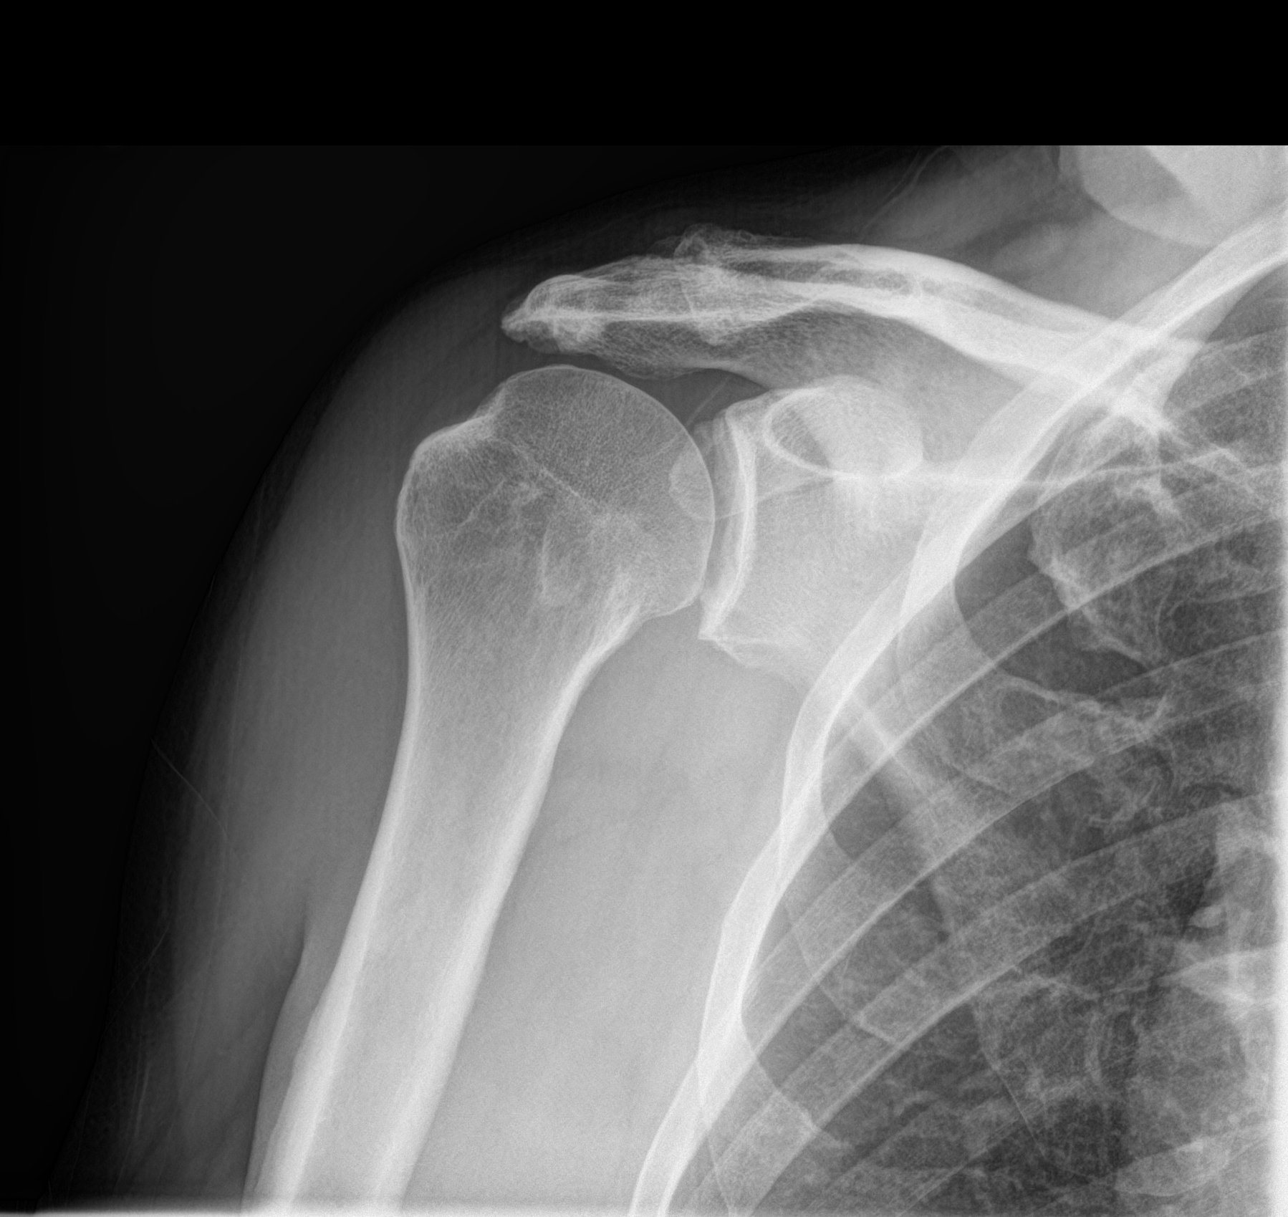
[im 2/3]
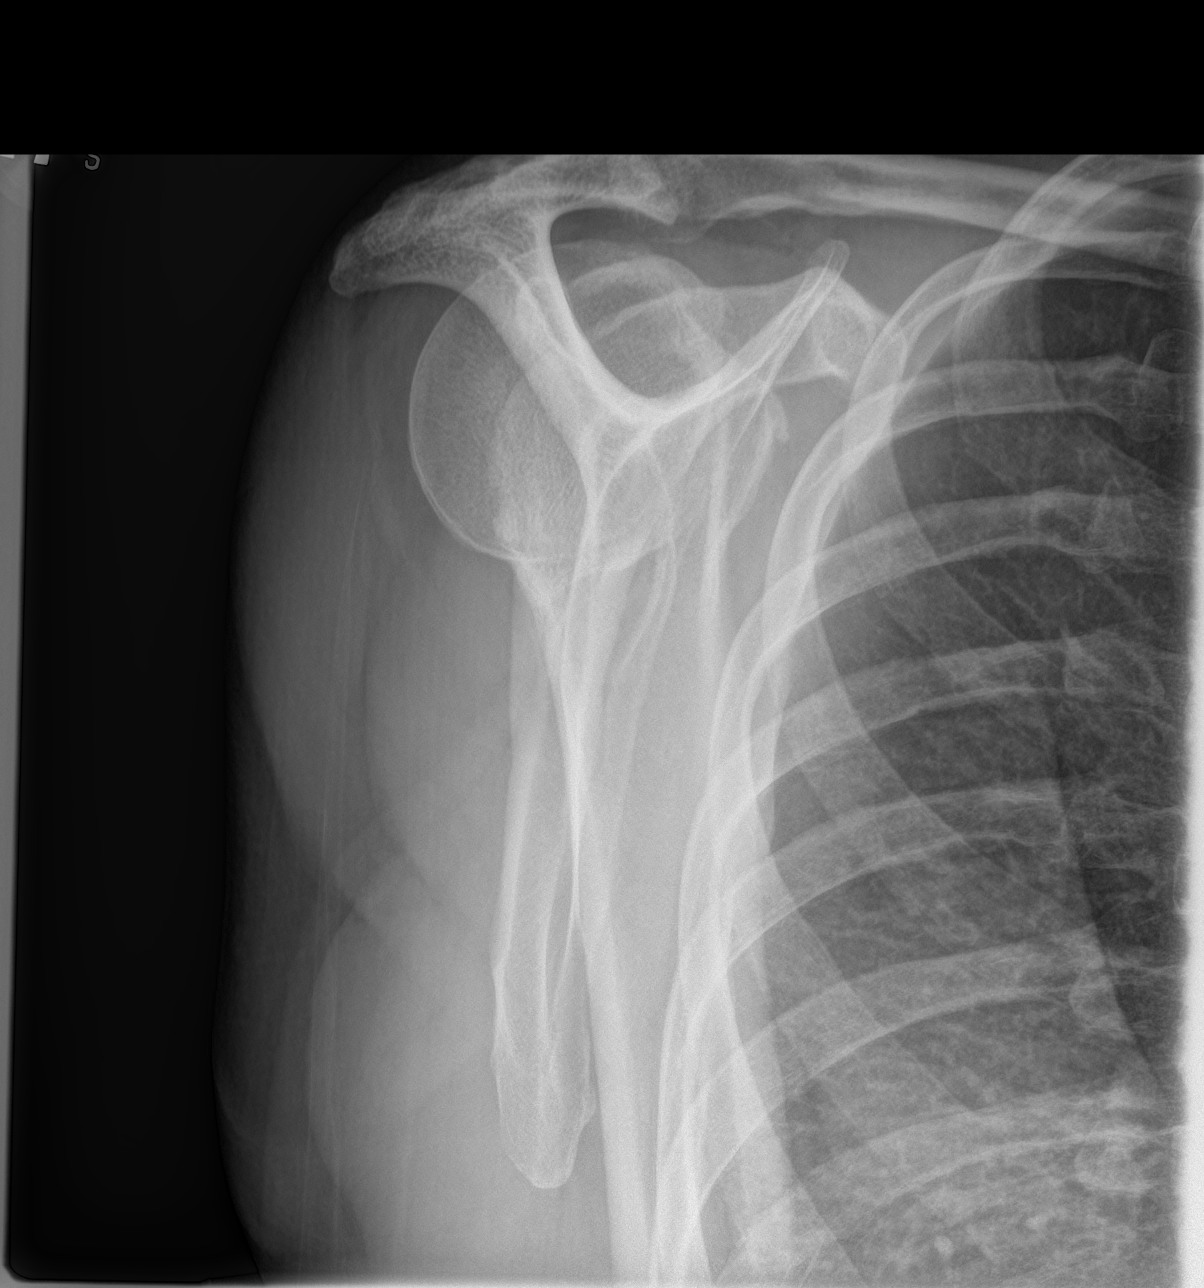
[im 3/3]
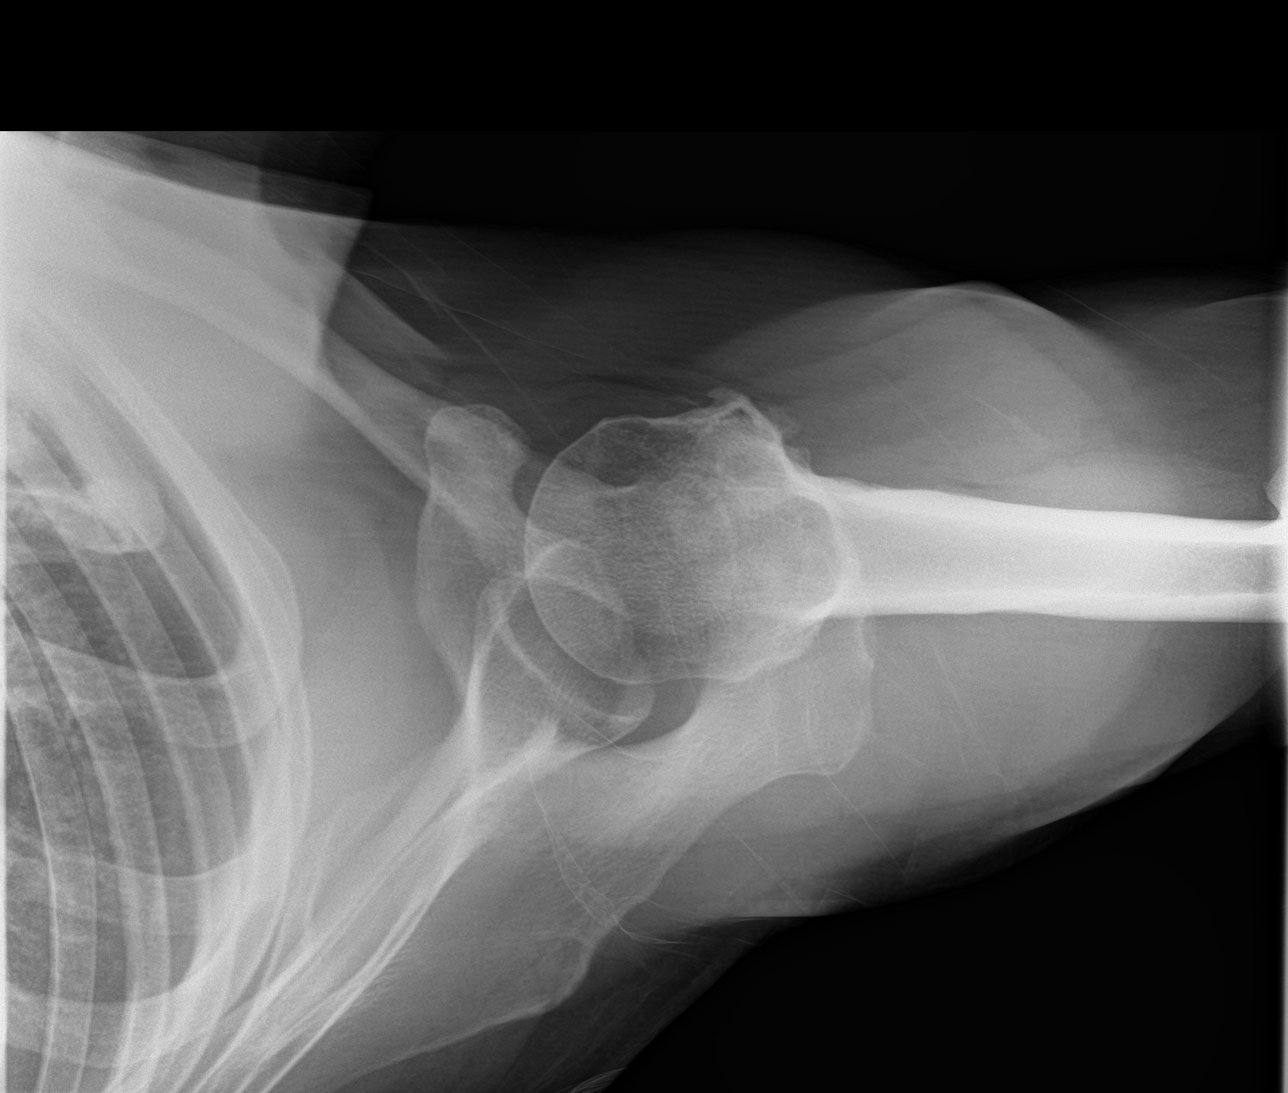

[3 of 3 positions shown; findings below may reference images not displayed]

FINDINGS: There is no evidence of fracture or dislocation. Mild degenerative
changes seen involving the right acromioclavicular joint. Soft
tissues are unremarkable.
IMPRESSION: Mild degenerative joint disease of the right acromioclavicular
joint. No acute abnormality seen in the right shoulder.

## 2020-03-18 ENCOUNTER — Ambulatory Visit: Payer: Self-pay | Admitting: Family Medicine

## 2020-05-05 ENCOUNTER — Ambulatory Visit: Payer: Self-pay | Admitting: Family Medicine

## 2020-05-30 ENCOUNTER — Other Ambulatory Visit: Payer: Self-pay | Admitting: Family Medicine

## 2020-05-30 MED ORDER — ALBUTEROL SULFATE HFA 108 (90 BASE) MCG/ACT IN AERS: 2.0000 | INHALATION_SPRAY | Freq: Four times a day (QID) | RESPIRATORY_TRACT | 3 refills | Status: AC | PRN

## 2020-05-30 NOTE — Telephone Encounter (Signed)
[facility] faxed refill request for the following medications:  albuterol (PROVENTIL HFA;VENTOLIN HFA) 108 (90 Base) MCG/ACT inhaler   Please advise. Thanks, [employer]

## 2020-06-09 ENCOUNTER — Telehealth: Payer: Self-pay

## 2020-06-09 NOTE — Telephone Encounter (Signed)
Patient Is asking for another referral to PT for his pinched nerve in back/neck

## 2020-06-10 NOTE — Telephone Encounter (Signed)
Schedule follow up appointment since no follow up physical exam in the past 6 months.

## 2020-06-10 NOTE — Telephone Encounter (Signed)
Please advise 

## 2020-06-14 ENCOUNTER — Ambulatory Visit (INDEPENDENT_AMBULATORY_CARE_PROVIDER_SITE_OTHER): Payer: PPO | Admitting: Family Medicine

## 2020-06-14 ENCOUNTER — Encounter: Payer: Self-pay | Admitting: Family Medicine

## 2020-06-14 ENCOUNTER — Other Ambulatory Visit: Payer: Self-pay

## 2020-06-14 VITALS — BP 124/69 | HR 73 | Temp 98.6°F | Wt 225.0 lb

## 2020-06-14 DIAGNOSIS — G8929 Other chronic pain: Secondary | ICD-10-CM

## 2020-06-14 DIAGNOSIS — Z23 Encounter for immunization: Secondary | ICD-10-CM

## 2020-06-14 DIAGNOSIS — M25511 Pain in right shoulder: Secondary | ICD-10-CM

## 2020-06-14 NOTE — Progress Notes (Signed)
Acute Office Visit  Subjective:    Patient ID: Tyler Gregory, male    DOB: 06/30/1951, 69 y.o.   MRN: [id number]  No chief complaint on file.   HPI Patient is in today requesting referral to Physical Therapy for neck/shoulder pain.  He has history of this and has had PT in the past. He states he has been doing his exercises for it at home but does not seem to be getting improvement.    Past Medical History:  Diagnosis Date  . Asthma     Past Surgical History:  Procedure Laterality Date  . HERNIA REPAIR  2013  . KNEE SURGERY  1990    No family history on file.  Social History   Socioeconomic History  . Marital status: Unknown    Spouse name: Not on file  . Number of children: Not on file  . Years of education: Not on file  . Highest education level: Not on file  Occupational History  . Not on file  Tobacco Use  . Smoking status: Former Smoker    Types: Cigarettes  . Smokeless tobacco: Never Used  Substance and Sexual Activity  . Alcohol use: Yes    Comment: OCCASIONALLY   . Drug use: Not Currently  . Sexual activity: Not on file  Other Topics Concern  . Not on file  Social History Narrative  . Not on file   Social Determinants of Health   Financial Resource Strain:   . Difficulty of Paying Living Expenses: Not on file  Food Insecurity:   . Worried About Charity fundraiser in the Last Year: Not on file  . Ran Out of Food in the Last Year: Not on file  Transportation Needs:   . Lack of Transportation (Medical): Not on file  . Lack of Transportation (Non-Medical): Not on file  Physical Activity:   . Days of Exercise per Week: Not on file  . Minutes of Exercise per Session: Not on file  Stress:   . Feeling of Stress : Not on file  Social Connections:   . Frequency of Communication with Friends and Family: Not on file  . Frequency of Social Gatherings with Friends and Family: Not on file  . Attends Religious Services: Not on file  . Active Member of  Clubs or Organizations: Not on file  . Attends Archivist Meetings: Not on file  . Marital Status: Not on file  Intimate Partner Violence:   . Fear of Current or Ex-Partner: Not on file  . Emotionally Abused: Not on file  . Physically Abused: Not on file  . Sexually Abused: Not on file    Outpatient Medications Prior to Visit  Medication Sig Dispense Refill  . albuterol (VENTOLIN HFA) 108 (90 Base) MCG/ACT inhaler Inhale 2 puffs into the lungs every 6 (six) hours as needed for wheezing or shortness of breath. 18 g 3   No facility-administered medications prior to visit.    Allergies  Allergen Reactions  . Shrimp [Shellfish Allergy] Swelling    Review of Systems  Musculoskeletal: Positive for arthralgias, myalgias and neck pain.       Objective:    Physical Exam Constitutional:      General: He is not in acute distress.    Appearance: He is well-developed.  HENT:     Head: Normocephalic and atraumatic.     Right Ear: Hearing normal.     Left Ear: Hearing normal.     Nose: Nose  normal.  Eyes:     General: Lids are normal. No scleral icterus.       Right eye: No discharge.        Left eye: No discharge.     Conjunctiva/sclera: Conjunctivae normal.  Pulmonary:     Effort: Pulmonary effort is normal. No respiratory distress.  Musculoskeletal:        General: Tenderness present. Normal range of motion.     Comments: Recurrence of right posterior shoulder pain along the medial border of the scapula. FROM in upper extremities with full strength bilaterally.  Skin:    Findings: No lesion or rash.  Neurological:     Mental Status: He is alert and oriented to person, place, and time.     Deep Tendon Reflexes: Reflexes normal.  Psychiatric:        Speech: Speech normal.        Behavior: Behavior normal.        Thought Content: Thought content normal.     BP 124/69 (BP Location: Right Arm, Patient Position: Sitting, Cuff Size: Normal)   Pulse 73   Temp 98.6  F (37 C) (Oral)   Wt 225 lb (102.1 kg)   SpO2 94%   BMI 30.52 kg/m  Wt Readings from Last 3 Encounters:  06/14/20 225 lb (102.1 kg)  12/17/19 227 lb (103 kg)  06/04/19 228 lb (103.4 kg)    Health Maintenance Due  Topic Date Due  . Hepatitis C Screening  Never done  . COVID-19 Vaccine (1) Never done  . TETANUS/TDAP  Never done  . COLONOSCOPY  Never done  . PNA vac Low Risk Adult (1 of 2 - PCV13) Never done  . INFLUENZA VACCINE  03/27/2020    There are no preventive care reminders to display for this patient.   Lab Results  Component Value Date   TSH 2.060 12/17/2019   Lab Results  Component Value Date   WBC 6.3 12/17/2019   HGB 15.9 12/17/2019   HCT 44.8 12/17/2019   MCV 88 12/17/2019   PLT 200 12/17/2019   Lab Results  Component Value Date   NA 140 12/17/2019   K 4.0 12/17/2019   CO2 23 12/17/2019   GLUCOSE 133 (H) 12/17/2019   BUN 15 12/17/2019   CREATININE 0.89 12/17/2019   BILITOT 0.5 12/17/2019   ALKPHOS 59 12/17/2019   AST 22 12/17/2019   ALT 30 12/17/2019   PROT 7.1 12/17/2019   ALBUMIN 4.6 12/17/2019   CALCIUM 9.5 12/17/2019   Lab Results  Component Value Date   CHOL 173 12/17/2019   Lab Results  Component Value Date   HDL 40 12/17/2019   Lab Results  Component Value Date   LDLCALC 76 12/17/2019   Lab Results  Component Value Date   TRIG 355 (H) 12/17/2019   Lab Results  Component Value Date   CHOLHDL 4.3 12/17/2019   No results found for: HGBA1C     Assessment & Plan:   1. Chronic right shoulder pain Recurrent right posterior shoulder pain over the past several weeks that he associates with strenuous activities. Similar episodes last Fall that responded well to PT and would like to go back. Advised he may use Salonpas patches and scheduled for PT evaluation and treatment. - Ambulatory referral to Physical Therapy  2. Need for influenza vaccination - Flu Vaccine QUAD High Dose(Fluad)    No orders of the defined types were  placed in this encounter.    Juluis Mire,  CMA

## 2020-06-30 ENCOUNTER — Telehealth: Payer: Self-pay | Admitting: Family Medicine

## 2020-06-30 NOTE — Telephone Encounter (Signed)
Copied from [place] [phone]. Topic: Medicare AWV >> Jun 30, 2020  2:15 PM Cher Nakai R wrote: Reason for CRM:   No answer unable to leave message for patient to call back and schedule Medicare Annual Wellness Visit (AWV) either virtually or in office.  No hx of AWV  - eligible as of 07/27/2018 awvi  Please schedule at anytime with [facility] Health Advisor.

## 2020-07-07 NOTE — Progress Notes (Addendum)
Subjective:   Tyler Gregory is a 69 y.o. male who presents for an Initial Medicare Annual Wellness Visit.  I connected with Tyler Gregory today by telephone and verified that I am speaking with the correct person using two identifiers. Location patient: home Location provider: work Persons participating in the virtual visit: patient, provider.   I discussed the limitations, risks, security and privacy concerns of performing an evaluation and management service by telephone and the availability of in person appointments. I also discussed with the patient that there may be a patient responsible charge related to this service. The patient expressed understanding and verbally consented to this telephonic visit.    Interactive audio and video telecommunications were attempted between this provider and patient, however failed, due to patient having technical difficulties OR patient did not have access to video capability.  We continued and completed visit with audio only.   Review of Systems    N/A  Cardiac Risk Factors include: advanced age (>49men, >38 women);male gender;sedentary lifestyle     Objective:    Today's Vitals   07/11/20 0815  PainSc: 8    There is no height or weight on file to calculate BMI.  Advanced Directives 07/11/2020 06/17/2019  Does Patient Have a Medical Advance Directive? No No  Would patient like information on creating a medical advance directive? No - Patient declined No - Patient declined    Current Medications (verified) Outpatient Encounter Medications as of 07/11/2020  Medication Sig   acetaminophen (TYLENOL) 325 MG tablet Take 650 mg by mouth every 6 (six) hours as needed.   albuterol (VENTOLIN HFA) 108 (90 Base) MCG/ACT inhaler Inhale 2 puffs into the lungs every 6 (six) hours as needed for wheezing or shortness of breath.   fluticasone (FLONASE) 50 MCG/ACT nasal spray Place 1 spray into both nostrils daily. As needed   No facility-administered  encounter medications on file as of 07/11/2020.    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  Tobacco Use   Smoking status: Former Smoker    Types: Cigarettes   Smokeless tobacco: Never Used   Tobacco comment: on and off; quit around 2000  Vaping Use   Vaping Use: Never used  Substance and Sexual Activity   Alcohol use: Yes    Alcohol/week: 12.0 standard drinks    Types: 12 Cans of beer per week    Comment: 0-2 beers a day   Drug use: Not Currently   Sexual activity: Not on file  Other Topics Concern   Not on file  Social History Narrative   Not on file   Social Determinants of Health   Financial Resource Strain: Low Risk    Difficulty of Paying Living Expenses: Not hard at all  Food Insecurity: No Food Insecurity   Worried About Charity fundraiser  in the Last Year: Never true   [place] in the Last Year: Never true  Transportation Needs: No Transportation Needs   Lack of Transportation (Medical): No   Lack of Transportation (Non-Medical): No  Physical Activity: Insufficiently Active   Days of Exercise per Week: 3 days   Minutes of Exercise per Session: 40 min  Stress: Stress Concern Present   Feeling of Stress : Rather much  Social Connections: Socially Isolated   Frequency of Communication with Friends and Family: More than three times a week   Frequency of Social Gatherings with  Friends and Family: More than three times a week   Attends Religious Services: Never   Marine scientist or Organizations: No   Attends Music therapist: Never   Marital Status: Divorced    Tobacco Counseling Counseling given: Not Answered Comment: on and off; quit around 2000   Clinical Intake:  Pre-visit preparation completed: Yes  Pain : 0-10 Pain Score: 8  Pain Type: Chronic pain Pain Location: Shoulder Pain Orientation: Right Pain Descriptors / Indicators: Aching Pain Frequency: Intermittent Pain Relieving Factors: Takes Tylenol as needed for pain.  Pain Relieving Factors: Takes Tylenol as needed for pain.  Nutritional Risks: None Diabetes: No  How often do you need to have someone help you when you read instructions, pamphlets, or other written materials from your doctor or pharmacy?: 1 - Never  Diabetic? No  Interpreter Needed?: No  Information entered by :: [facility], LPN   Activities of Daily Living In your present state of health, do you have any difficulty performing the following activities: 07/11/2020 12/17/2019  Hearing? N N  Vision? N N  Difficulty concentrating or making decisions? N N  Walking or climbing stairs? N N  Dressing or bathing? N N  Doing errands, shopping? N N  Preparing Food and eating ? N -  Using the Toilet? N -  In the past six months, have you accidently leaked urine? N -  Do you have problems with loss of bowel control? N -  Managing your Medications? N -  Managing your Finances? N -  Housekeeping or managing your Housekeeping? N -  Some recent data might be hidden    Patient Care Team: Chrismon, Vickki Muff, PA as PCP - General (Family Medicine) Arelia Sneddon, OD (Optometry)  Indicate any recent Medical Services you may have received from other than [facility] providers in the past year (date may be approximate).     Assessment:   This is a routine wellness examination for Tyler Gregory.  Hearing/Vision  screen No exam data present  Dietary issues and exercise activities discussed: Current Exercise Habits: Home exercise routine;The patient has a physically strenuous job, but has no regular exercise apart from work., Type of exercise: strength training/weights;walking;stretching, Time (Minutes): 45, Frequency (Times/Week): 3, Weekly Exercise (Minutes/Week): 135, Intensity: Mild, Exercise limited by: orthopedic condition(s)  Goals      DIET - INCREASE WATER INTAKE     Recommend to drink at least 6-8 8oz glasses of water per day.       Depression Screen PHQ 2/9 Scores 07/11/2020 12/17/2019 08/21/2018  PHQ - 2 Score 0 0 0    Fall Risk Fall Risk  07/11/2020 12/17/2019 08/21/2018  Falls in the past year? 0 0 0  Number falls in past yr: 0 0 -  Injury with Fall? 0 0 -    Any stairs in or around the home? No  If so, are there any without  handrails? No  Home free of loose throw rugs in walkways, pet beds, electrical cords, etc? Yes  Adequate lighting in your home to reduce risk of falls? Yes   ASSISTIVE DEVICES UTILIZED TO PREVENT FALLS:  Life alert? No  Use of a cane, walker or w/c? No  Grab bars in the bathroom? No  Shower chair or bench in shower? No  Elevated toilet seat or a handicapped toilet? No    Cognitive Function: Declined today.         Immunizations Immunization History  Administered Date(s) Administered   Fluad Quad(high Dose 65+) 06/04/2019, 06/14/2020    TDAP status: Due, Education has been provided regarding the importance of this vaccine. Advised may receive this vaccine at local pharmacy or Health Dept. Aware to provide a copy of the vaccination record if obtained from local pharmacy or Health Dept. Verbalized acceptance and understanding. Flu Vaccine status: Up to date Pneumococcal vaccine status: Declined,  Education has been provided regarding the importance of this vaccine but patient still declined. Advised may receive this vaccine at local pharmacy or  Health Dept. Aware to provide a copy of the vaccination record if obtained from local pharmacy or Health Dept. Verbalized acceptance and understanding.  Covid-19 vaccine status: Declined, Education has been provided regarding the importance of this vaccine but patient still declined. Advised may receive this vaccine at local pharmacy or Health Dept.or vaccine clinic. Aware to provide a copy of the vaccination record if obtained from local pharmacy or Health Dept. Verbalized acceptance and understanding.  Qualifies for Shingles Vaccine? Yes   Zostavax completed No   Shingrix Completed?: No.    Education has been provided regarding the importance of this vaccine. Patient has been advised to call insurance company to determine out of pocket expense if they have not yet received this vaccine. Advised may also receive vaccine at local pharmacy or Health Dept. Verbalized acceptance and understanding.  Screening Tests Health Maintenance  Topic Date Due   Hepatitis C Screening  Never done   COLONOSCOPY  Never done   PNA vac Low Risk Adult (1 of 2 - PCV13) Never done   COVID-19 Vaccine (1) 07/27/2020 (Originally 08/18/1963)   TETANUS/TDAP  07/11/2021 (Originally 08/19/1970)   [place]  Completed    Health Maintenance  Health Maintenance Due  Topic Date Due   Hepatitis C Screening  Never done   COLONOSCOPY  Never done   PNA vac Low Risk Adult (1 of 2 - PCV13) Never done    Colorectal cancer screening: Referral to GI placed today. Pt aware the office will call re: appt.  Lung Cancer Screening: (Low Dose CT Chest recommended if Age 60-80 years, 30 pack-year currently smoking OR have quit w/in 15years.) does not qualify.   Additional Screening:  Hepatitis C Screening: does qualify and would like this added to next in office blood work orders.   Vision Screening: Recommended annual ophthalmology exams for early detection of glaucoma and other disorders of the eye. Is the patient up to  date with their annual eye exam?  Yes  Who is the provider or what is the name of the office in which the patient attends annual eye exams? Dr Annamaria Helling @ [facility] If pt is not established with a provider, would they like to be referred to a provider to establish care? No .   Dental Screening: Recommended annual dental exams for proper oral hygiene  Community Resource Referral / Chronic Care Management: CRR required this visit?  No  CCM required this visit?  No      Plan:     I have personally reviewed and noted the following in the patient's chart:   Medical and social history Use of alcohol, tobacco or illicit drugs  Current medications and supplements Functional ability and status Nutritional status Physical activity Advanced directives List of other physicians Hospitalizations, surgeries, and ER visits in previous 12 months Vitals Screenings to include cognitive, depression, and falls Referrals and appointments  In addition, I have reviewed and discussed with patient certain preventive protocols, quality metrics, and best practice recommendations. A written personalized care plan for preventive services as well as general preventive health recommendations were provided to patient.     Dannon Nguyenthi [place], [place]   [id number]   Nurse Notes: Pt would like to receive the Prevnar 13 vaccine and add the Hep C lab order to next blood work orders.   Reviewed note and plan of Nurse Health Advisor screening. Agree with documentation and recommendations.

## 2020-07-11 ENCOUNTER — Ambulatory Visit: Payer: PPO | Attending: Family Medicine | Admitting: Physical Therapy

## 2020-07-11 ENCOUNTER — Other Ambulatory Visit: Payer: Self-pay

## 2020-07-11 ENCOUNTER — Ambulatory Visit (INDEPENDENT_AMBULATORY_CARE_PROVIDER_SITE_OTHER): Payer: PPO

## 2020-07-11 ENCOUNTER — Telehealth: Payer: PPO

## 2020-07-11 ENCOUNTER — Encounter: Payer: Self-pay | Admitting: Physical Therapy

## 2020-07-11 DIAGNOSIS — Z1211 Encounter for screening for malignant neoplasm of colon: Secondary | ICD-10-CM | POA: Diagnosis not present

## 2020-07-11 DIAGNOSIS — M5413 Radiculopathy, cervicothoracic region: Secondary | ICD-10-CM | POA: Diagnosis not present

## 2020-07-11 DIAGNOSIS — M25611 Stiffness of right shoulder, not elsewhere classified: Secondary | ICD-10-CM | POA: Diagnosis not present

## 2020-07-11 DIAGNOSIS — G8929 Other chronic pain: Secondary | ICD-10-CM | POA: Insufficient documentation

## 2020-07-11 DIAGNOSIS — M542 Cervicalgia: Secondary | ICD-10-CM | POA: Diagnosis not present

## 2020-07-11 DIAGNOSIS — M25511 Pain in right shoulder: Secondary | ICD-10-CM | POA: Insufficient documentation

## 2020-07-11 DIAGNOSIS — Z0000 Encounter for general adult medical examination without abnormal findings: Secondary | ICD-10-CM

## 2020-07-11 NOTE — Patient Instructions (Signed)
Mr. Tyler Gregory , Thank you for taking time to come for your Medicare Wellness Visit. I appreciate your ongoing commitment to your health goals. Please review the following plan we discussed and let me know if I can assist you in the future.   Screening recommendations/referrals: Colonoscopy: Referral to GI placed today. Pt aware the office will call re: appt. Recommended yearly ophthalmology/optometry visit for glaucoma screening and checkup Recommended yearly dental visit for hygiene and checkup  Vaccinations: Influenza vaccine: Done 06/14/20 Pneumococcal vaccine: Prevnar 13 due. Will receive at next in office apt (08/08/20). Tdap vaccine: Currently due, declined at this time.  Shingles vaccine: Shingrix discussed. Please contact your pharmacy for coverage information.     Advanced directives: Advance directive discussed with you today. Even though you declined this today please call our office should you change your mind and we can give you the proper paperwork for you to fill out.  Conditions/risks identified: Recommend to drink at least 6-8 8oz glasses of water per day.  Next appointment: 08/08/20 @ 10:40 AM with [facility] 68 Years and Older, Male Preventive care refers to lifestyle choices and visits with your health care provider that can promote health and wellness. What does preventive care include?  A yearly physical exam. This is also called an annual well check.  Dental exams once or twice a year.  Routine eye exams. Ask your health care provider how often you should have your eyes checked.  Personal lifestyle choices, including:  Daily care of your teeth and gums.  Regular physical activity.  Eating a healthy diet.  Avoiding tobacco and drug use.  Limiting alcohol use.  Practicing safe sex.  Taking low doses of aspirin every day.  Taking vitamin and mineral supplements as recommended by your health care provider. What happens during an  annual well check? The services and screenings done by your health care provider during your annual well check will depend on your age, overall health, lifestyle risk factors, and family history of disease. Counseling  Your health care provider may ask you questions about your:  Alcohol use.  Tobacco use.  Drug use.  Emotional well-being.  Home and relationship well-being.  Sexual activity.  Eating habits.  History of falls.  Memory and ability to understand (cognition).  Work and work Statistician. Screening  You may have the following tests or measurements:  Height, weight, and BMI.  Blood pressure.  Lipid and cholesterol levels. These may be checked every 5 years, or more frequently if you are over 15 years old.  Skin check.  Lung cancer screening. You may have this screening every year starting at age 68 if you have a 30-pack-year history of smoking and currently smoke or have quit within the past 15 years.  Fecal occult blood test (FOBT) of the stool. You may have this test every year starting at age 68.  Flexible sigmoidoscopy or colonoscopy. You may have a sigmoidoscopy every 5 years or a colonoscopy every 10 years starting at age 68.  Prostate cancer screening. Recommendations will vary depending on your family history and other risks.  Hepatitis C blood test.  Hepatitis B blood test.  Sexually transmitted disease (STD) testing.  Diabetes screening. This is done by checking your blood sugar (glucose) after you have not eaten for a while (fasting). You may have this done every 1-3 years.  Abdominal aortic aneurysm (AAA) screening. You may need this if you are a current or former smoker.  Osteoporosis. You may  be screened starting at age 68 if you are at high risk. Talk with your health care provider about your test results, treatment options, and if necessary, the need for more tests. Vaccines  Your health care provider may recommend certain vaccines,  such as:  Influenza vaccine. This is recommended every year.  Tetanus, diphtheria, and acellular pertussis (Tdap, Td) vaccine. You may need a Td booster every 10 years.  Zoster vaccine. You may need this after age 68.  Pneumococcal 13-valent conjugate (PCV13) vaccine. One dose is recommended after age 68.  Pneumococcal polysaccharide (PPSV23) vaccine. One dose is recommended after age 68. Talk to your health care provider about which screenings and vaccines you need and how often you need them. This information is not intended to replace advice given to you by your health care provider. Make sure you discuss any questions you have with your health care provider. Document Released: 09/09/2015 Document Revised: 05/02/2016 Document Reviewed: 06/14/2015 Elsevier Interactive Patient Education  2017 [employer] Prevention in the Home Falls can cause injuries. They can happen to people of all ages. There are many things you can do to make your home safe and to help prevent falls. What can I do on the outside of my home?  Regularly fix the edges of walkways and driveways and fix any cracks.  Remove anything that might make you trip as you walk through a door, such as a raised step or threshold.  Trim any bushes or trees on the path to your home.  Use bright outdoor lighting.  Clear any walking paths of anything that might make someone trip, such as rocks or tools.  Regularly check to see if handrails are loose or broken. Make sure that both sides of any steps have handrails.  Any raised decks and porches should have guardrails on the edges.  Have any leaves, snow, or ice cleared regularly.  Use sand or salt on walking paths during winter.  Clean up any spills in your garage right away. This includes oil or grease spills. What can I do in the bathroom?  Use night lights.  Install grab bars by the toilet and in the tub and shower. Do not use towel bars as grab bars.  Use  non-skid mats or decals in the tub or shower.  If you need to sit down in the shower, use a plastic, non-slip stool.  Keep the floor dry. Clean up any water that spills on the floor as soon as it happens.  Remove soap buildup in the tub or shower regularly.  Attach bath mats securely with double-sided non-slip rug tape.  Do not have throw rugs and other things on the floor that can make you trip. What can I do in the bedroom?  Use night lights.  Make sure that you have a light by your bed that is easy to reach.  Do not use any sheets or blankets that are too big for your bed. They should not hang down onto the floor.  Have a firm chair that has side arms. You can use this for support while you get dressed.  Do not have throw rugs and other things on the floor that can make you trip. What can I do in the kitchen?  Clean up any spills right away.  Avoid walking on wet floors.  Keep items that you use a lot in easy-to-reach places.  If you need to reach something above you, use a strong step stool that has  a grab bar.  Keep electrical cords out of the way.  Do not use floor polish or wax that makes floors slippery. If you must use wax, use non-skid floor wax.  Do not have throw rugs and other things on the floor that can make you trip. What can I do with my stairs?  Do not leave any items on the stairs.  Make sure that there are handrails on both sides of the stairs and use them. Fix handrails that are broken or loose. Make sure that handrails are as long as the stairways.  Check any carpeting to make sure that it is firmly attached to the stairs. Fix any carpet that is loose or worn.  Avoid having throw rugs at the top or bottom of the stairs. If you do have throw rugs, attach them to the floor with carpet tape.  Make sure that you have a light switch at the top of the stairs and the bottom of the stairs. If you do not have them, ask someone to add them for you. What  else can I do to help prevent falls?  Wear shoes that:  Do not have high heels.  Have rubber bottoms.  Are comfortable and fit you well.  Are closed at the toe. Do not wear sandals.  If you use a stepladder:  Make sure that it is fully opened. Do not climb a closed stepladder.  Make sure that both sides of the stepladder are locked into place.  Ask someone to hold it for you, if possible.  Clearly mark and make sure that you can see:  Any grab bars or handrails.  First and last steps.  Where the edge of each step is.  Use tools that help you move around (mobility aids) if they are needed. These include:  Canes.  Walkers.  Scooters.  Crutches.  Turn on the lights when you go into a dark area. Replace any light bulbs as soon as they burn out.  Set up your furniture so you have a clear path. Avoid moving your furniture around.  If any of your floors are uneven, fix them.  If there are any pets around you, be aware of where they are.  Review your medicines with your doctor. Some medicines can make you feel dizzy. This can increase your chance of falling. Ask your doctor what other things that you can do to help prevent falls. This information is not intended to replace advice given to you by your health care provider. Make sure you discuss any questions you have with your health care provider. Document Released: 06/09/2009 Document Revised: 01/19/2016 Document Reviewed: 09/17/2014 Elsevier Interactive Patient Education  2017 [employer].

## 2020-07-11 NOTE — Therapy (Signed)
[facility] [address] [place], [address] Phone: [phone]   Fax:  [phone]  Physical Therapy Evaluation  Patient Details  Name: Tyler Gregory MRN: [id number] Date of Birth: [birth year] No data recorded  Encounter Date: 07/11/2020   PT End of Session - 07/11/20 1620    Visit Number 1    Number of Visits 17    Date for PT Re-Evaluation 08/08/20    PT Start Time 0230    PT Stop Time 0315    PT Time Calculation (min) 45 min    Activity Tolerance Patient tolerated treatment well    Behavior During Therapy [facility] for tasks assessed/performed           Past Medical History:  Diagnosis Date  . Asthma     Past Surgical History:  Procedure Laterality Date  . HERNIA REPAIR  2013  . KNEE SURGERY  1990    There were no vitals filed for this visit.  OBJECTIVE Palpation Trigger points at R upper and middle trap, rhomboids, R pec minor and thoracic paraspinals; concordant pain sign at mid trap/rhomboid trigger points   Posture: forward head, rounded shoulders, tspine kyphosis    Strength R/L 5 with pain/5 Shoulder flexion 5 with pain/5 Shoulder abduction  5 with pain/5Shoulder external rotation  5/5 Shoulder internal rotation   5/5 Shoulder extension 5/5 Shoulder horizontal abduction 5/5 Shoulder shrug (upper traps)   5/5 Elbow flexion  5/5 Elbow extension 5/5 Wrist Extension 5/5 Wrist Flexion 5/5 Finger adduction  Prone  Y lower trap 4/5  T middle trap 4+/5     AROM R/L done in sitting  Full ROM Shoulder flexion Full ROM Shoulder abduction  Occiput/C8 L Shoulder external rotation L3/L1 Shoulder internal rotation 60/60 Shoulder extension    PROM R/L in supine  160 with pain/180Shoulder flexion 127 with pain/180 Shoulder abduction   90/90 Shoulder external rotation  90/90 Shoulder internal rotation Shoulder extension   Scapulothoracic: Distraction: difficult on R side d/t  muscle tightness  Medial: Normal bilaterlly Downward rotation: R side limited d/t mm tightness  Lateral: difficult on R side d/t muscle tightness   Superior: Normal bilaterally   Sensation Intact bilateral UE as determined by testing dermatomes C2-T2    SPECIAL TESTS Drop Arm Test: Negative  Painful Arc: Negative Empty Can: some pain     Objective measurements completed on examination: See above findings.    Therex PT reviewed the following HEP with patient with patient able to demonstrate a set of the following with min cuing for correction needed. PT educated patient on parameters of therex (how/when to inc/decrease intensity, frequency, rep/set range, stretch hold time, and purpose of therex) with verbalized understanding.  Trapezius Stretch 2x30sec, 2xday Pec Strech 2x30sec, 2xday Cervical and scapular retraction 2x15, 5sec hold 2xday           PT Education - 07/11/20 1618    Education Details therex form/technique, anatomy and physiology involved, HEP given    Person(s) Educated Patient    Methods Explanation;Demonstration;Verbal cues    Comprehension Verbalized understanding;Returned demonstration;Verbal cues required            PT Short Term Goals - 07/11/20 1650      PT SHORT TERM GOAL #1   Title Pt will be independent with HEP in order to improve strength and decrease pain in order to improve pain-free function at home and work.    Baseline 07/11/20 HEP given  Time 4    Period Weeks    Status New    Target Date 08/08/20             PT Long Term Goals - 07/11/20 1651      PT LONG TERM GOAL #1   Title Patient will increase FOTO score to 74 to demonstrate predicted increase in functional mobility to complete ADLs    Baseline 07/12/20 57    Time 8    Period Weeks    Status New      PT LONG TERM GOAL #2   Title Pt will decrease worst pain as reported on NPRS by at least 3 points in order to demonstrate clinically significant reduction in pain.     Baseline 07/11/20 10/10 at worst    Time 8    Period Weeks    Status New      PT LONG TERM GOAL #3   Title Pt will increase periscapular strength to 5/5 in order to demonstrate improvement in strength and be able to complete overhead activities without difficulty.    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    Baseline 07/11/20 rounded shoulders, forward head, thoracic kyphosis    Time 8    Period Weeks    Status New                  Plan - 07/11/20 1622    Clinical Impression Statement Patient is 69 year old male presenting with R shoulder pain without radicular symptoms. Pt has impairments in shoulder and thoracic mobility, postural deficits, muscle tightness, trigger points, and decreased neuromuscular control. Pt has limitations in driving, lifting, and activities involving overhead motion limiting his participation at work. Pt will benefit from skilled PT to address deficits found during examination to return to PLOF and work w/o pain.    Personal Factors and Comorbidities Age;Comorbidity 1;Fitness;Profession    Comorbidities asthma    Examination-Activity Limitations Sit;Lift;Carry;Reach Overhead    Examination-Participation Restrictions Community Activity;Driving    Stability/Clinical Decision Making Evolving/Moderate complexity    Clinical Decision Making Moderate    Rehab Potential Good    PT Frequency 2x / week    PT Duration 8 weeks    PT Treatment/Interventions ADLs/Self Care Home Management;Iontophoresis 4mg /ml Dexamethasone;Electrical Stimulation;Functional mobility training;Neuromuscular re-education;Taping;Spinal Manipulations;Joint Manipulations;Vestibular;Passive range of motion;Dry needling;Ultrasound;Traction;Cryotherapy;Moist Heat;Therapeutic  exercise;Therapeutic activities;Patient/family education;Manual techniques    PT Next Visit Plan dry needling, postural exercises, stretching    PT Home Exercise Plan cervical and scapular retraction, pec stretch, trap stretch    Consulted and Agree with Plan of Care Patient           Patient will benefit from skilled therapeutic intervention in order to improve the following deficits and impairments:  Decreased endurance, Decreased mobility, Hypomobility, Increased muscle spasms, Impaired sensation, Decreased activity tolerance, Decreased strength, Increased fascial restricitons, Impaired flexibility, Impaired UE functional use, Postural dysfunction, Pain, Improper body mechanics, Impaired tone, Decreased range of motion  Visit Diagnosis: Chronic right shoulder pain  Stiffness of right shoulder, not elsewhere classified     Problem List There are no problems to display for this patient.  [address] [place], SPT Durwin Reges 07/12/2020, 1:46 PM  [facility] [address], [place], [address] Phone: [phone]   Fax:  [phone]  Name: Tyler Gregory MRN: [id number] Date of Birth: 05-Oct-1950

## 2020-07-13 ENCOUNTER — Encounter: Payer: Self-pay | Admitting: Physical Therapy

## 2020-07-13 ENCOUNTER — Other Ambulatory Visit: Payer: Self-pay

## 2020-07-13 ENCOUNTER — Ambulatory Visit: Payer: PPO | Admitting: Physical Therapy

## 2020-07-13 DIAGNOSIS — M25511 Pain in right shoulder: Secondary | ICD-10-CM | POA: Diagnosis not present

## 2020-07-13 DIAGNOSIS — M25611 Stiffness of right shoulder, not elsewhere classified: Secondary | ICD-10-CM

## 2020-07-13 DIAGNOSIS — G8929 Other chronic pain: Secondary | ICD-10-CM

## 2020-07-13 DIAGNOSIS — M542 Cervicalgia: Secondary | ICD-10-CM

## 2020-07-13 DIAGNOSIS — M5413 Radiculopathy, cervicothoracic region: Secondary | ICD-10-CM

## 2020-07-13 NOTE — Therapy (Signed)
[facility] [address] [place], [address] Phone: [phone]   Fax:  [phone]  Physical Therapy Treatment  Patient Details  Name: Tyler Gregory MRN: [id number] Date of Birth: 09-Apr-1951 No data recorded  Encounter Date: 07/13/2020   PT End of Session - 07/13/20 1004    Visit Number 2    Number of Visits 17    Date for PT Re-Evaluation 08/08/20    PT Start Time 0945    PT Stop Time 1025    PT Time Calculation (min) 40 min    Activity Tolerance Patient tolerated treatment well    Behavior During Therapy [facility] for tasks assessed/performed           Past Medical History:  Diagnosis Date  . Asthma     Past Surgical History:  Procedure Laterality Date  . HERNIA REPAIR  2013  . KNEE SURGERY  1990    There were no vitals filed for this visit.   Subjective Assessment - 07/13/20 0949    Subjective Patinet reports 03/03/09 pain today. Compliance with HEP, but continued tension at R periscapular area.    Pertinent History Pt is a 69 year old male with R shoulder and scapular pain that has been on and off for many years, but it flared up again about 82months ago. Patient states he is back at the gym and working out regularly. 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 Patient reports the pain at worst can get up to 10/10 and the best it gets is a 2/10, and that is usually in the mornings. Pt denies N/V, B&B changes, saddle paresthesia, fever, night sweats, or unexplained weight loss.    Limitations House hold activities;Lifting    How long can you sit comfortably? unimited    How long can you stand comfortably? unlimited    How long can you walk comfortably? 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 Patient was educated on risks and benefits of therapy and verbally consents to PT.   Ther-Ex - Seated thoracic ext over foam roll (hands behind head) x12 2-3sec hold with cuing for breath control with good carry over - Theraball rollouts (modified childs post) 12x 10sec w/ blue ball for increased thoracic ext for rollout, min cuing for breath control with good carry over - Cobra stretch x12 with breath control, cuing for technique with noted decreased mobility in this position  - Prone ext (chest lift) bilat hands at sides 2x 12 with cuing initially for technique with good carry over - Seated rows 35#3x 10 with min cuing for set up posture with good carry over - Y on wall 2x 10 with heavy cuing for understanding of scapular motion initially with good carry over following Doorway pec stretch 70min UT stretch 15min           PT Education - 07/13/20 1003    Education Details therex form/technique, TDN    Person(s) Educated Patient  Methods Explanation;Demonstration;Verbal cues    Comprehension Verbalized understanding;Returned demonstration;Verbal cues required            PT Short Term Goals - 07/11/20 1650      PT SHORT TERM GOAL #1   Title Pt will be independent with HEP in order to improve strength and decrease pain in order to improve pain-free function at home and work.    Baseline 07/11/20 HEP given    Time 4    Period Weeks    Status New    Target Date 08/08/20             PT Long Term Goals - 07/11/20 1651      PT LONG TERM GOAL #1   Title Patient will increase FOTO score to 74 to demonstrate predicted increase in functional mobility to complete ADLs    Baseline 07/12/20 57    Time 8     Period Weeks    Status New      PT LONG TERM GOAL #2   Title Pt will decrease worst pain as reported on NPRS by at least 3 points in order to demonstrate clinically significant reduction in pain.    Baseline 07/11/20 10/10 at worst    Time 8    Period Weeks    Status New      PT LONG TERM GOAL #3   Title Pt will increase periscapular strength to 5/5 in order to demonstrate improvement in strength and be able to complete overhead activities without difficulty.    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    Baseline 07/11/20 rounded shoulders, forward head, thoracic kyphosis    Time 8    Period Weeks    Status New                 Plan - 07/13/20 1008    Clinical Impression Statement PT utilized manual and TDN techniques to relieve muscle tension and reduce pain with good success (localized twitch response and pain reduction). PT initiated therex progression for increased mobility as well as postural strengthening with success. Patient is able to comply with all cuing for proper technique of therex with good motivation throughout session. PT will continue progression as able.    Personal Factors and Comorbidities Age;Comorbidity 1;Fitness;Profession    Comorbidities asthma    Examination-Activity Limitations Sit;Lift;Carry;Reach Overhead    Examination-Participation Restrictions Community Activity;Driving    Stability/Clinical Decision Making Evolving/Moderate complexity    Clinical Decision Making Moderate    Rehab Potential Good    PT Frequency 2x / week    PT Duration 8 weeks    PT Treatment/Interventions ADLs/Self Care Home Management;Iontophoresis 4mg /ml Dexamethasone;Electrical Stimulation;Functional mobility training;Neuromuscular  re-education;Taping;Spinal Manipulations;Joint Manipulations;Vestibular;Passive range of motion;Dry needling;Ultrasound;Traction;Cryotherapy;Moist Heat;Therapeutic exercise;Therapeutic activities;Patient/family education;Manual techniques    PT Next Visit Plan dry needling, postural exercises, stretching    PT Home Exercise Plan cervical and scapular retraction, pec stretch, trap stretch    Consulted and Agree with Plan of Care Patient           Patient will benefit from skilled therapeutic intervention in order to improve the following deficits and impairments:  Decreased endurance, Decreased mobility, Hypomobility, Increased muscle spasms, Impaired sensation, Decreased activity  tolerance, Decreased strength, Increased fascial restricitons, Impaired flexibility, Impaired UE functional use, Postural dysfunction, Pain, Improper body mechanics, Impaired tone, Decreased range of motion  Visit Diagnosis: Chronic right shoulder pain  Stiffness of right shoulder, not elsewhere classified  Cervicalgia  Radiculopathy, cervicothoracic region     Problem List There are no problems to display for this patient.   Durwin Reges 07/13/2020, 10:24 AM  [facility] [address] [place], [address] Phone: [phone]   Fax:  [phone]  Name: Tyler Gregory MRN: [id number] Date of Birth: 1951-01-14

## 2020-07-18 ENCOUNTER — Other Ambulatory Visit: Payer: Self-pay

## 2020-07-18 ENCOUNTER — Ambulatory Visit: Payer: PPO | Admitting: Physical Therapy

## 2020-07-18 ENCOUNTER — Telehealth (INDEPENDENT_AMBULATORY_CARE_PROVIDER_SITE_OTHER): Payer: Self-pay | Admitting: Gastroenterology

## 2020-07-18 DIAGNOSIS — Z1211 Encounter for screening for malignant neoplasm of colon: Secondary | ICD-10-CM

## 2020-07-18 MED ORDER — NA SULFATE-K SULFATE-MG SULF 17.5-3.13-1.6 GM/177ML PO SOLN: 1.0000 | Freq: Once | ORAL | 0 refills | Status: AC

## 2020-07-18 NOTE — Progress Notes (Signed)
Gastroenterology Pre-Procedure Review  Request Date: Thursday 08/11/20 Requesting Physician: Dr. Vicente Males  PATIENT REVIEW QUESTIONS: The patient responded to the following health history questions as indicated:    1. Are you having any GI issues? no 2. Do you have a personal history of Polyps? no 3. Do you have a family history of Colon Cancer or Polyps? no 4. Diabetes Mellitus? no 5. Joint replacements in the past 12 months?no 6. Major health problems in the past 3 months?no 7. Any artificial heart valves, MVP, or defibrillator?no    MEDICATIONS & ALLERGIES:    Patient reports the following regarding taking any anticoagulation/antiplatelet therapy:   Plavix, Coumadin, Eliquis, Xarelto, Lovenox, Pradaxa, Brilinta, or Effient? no Aspirin? no  Patient confirms/reports the following medications:  Current Outpatient Medications  Medication Sig Dispense Refill  . acetaminophen (TYLENOL) 325 MG tablet Take 650 mg by mouth every 6 (six) hours as needed.    Marland Kitchen albuterol (VENTOLIN HFA) 108 (90 Base) MCG/ACT inhaler Inhale 2 puffs into the lungs every 6 (six) hours as needed for wheezing or shortness of breath. 18 g 3  . fluticasone (FLONASE) 50 MCG/ACT nasal spray Place 1 spray into both nostrils daily. As needed    . Na Sulfate-K Sulfate-Mg Sulf 17.5-3.13-1.6 GM/177ML SOLN Take 1 kit by mouth once for 1 dose. 354 mL 0   No current facility-administered medications for this visit.    Patient confirms/reports the following allergies:  Allergies  Allergen Reactions  . Shrimp [Shellfish Allergy] Swelling    Orders Placed This Encounter  Procedures  . Procedural/ Surgical Case Request: COLONOSCOPY WITH PROPOFOL    Standing Status:   Standing    Number of Occurrences:   1    Order Specific Question:   Pre-op diagnosis    Answer:   screening colonoscopy    Order Specific Question:   CPT Code    Answer:   19622    AUTHORIZATION INFORMATION Primary Insurance: 1D#: Group #:  Secondary  Insurance: 1D#: Group #:  SCHEDULE INFORMATION: Date: 08/11/20 Time: Location:[facility]

## 2020-07-19 ENCOUNTER — Other Ambulatory Visit: Payer: Self-pay

## 2020-07-19 ENCOUNTER — Encounter: Payer: Self-pay | Admitting: Physical Therapy

## 2020-07-19 ENCOUNTER — Ambulatory Visit: Payer: PPO | Admitting: Physical Therapy

## 2020-07-19 DIAGNOSIS — M25611 Stiffness of right shoulder, not elsewhere classified: Secondary | ICD-10-CM

## 2020-07-19 DIAGNOSIS — M25511 Pain in right shoulder: Secondary | ICD-10-CM | POA: Diagnosis not present

## 2020-07-19 DIAGNOSIS — G8929 Other chronic pain: Secondary | ICD-10-CM

## 2020-07-19 DIAGNOSIS — M542 Cervicalgia: Secondary | ICD-10-CM

## 2020-07-19 NOTE — Therapy (Signed)
[place] [facility] [address], [place], [address] Phone: [phone]   Fax:  [phone]  Physical Therapy Treatment  Patient Details  Name: ABRIAN HANOVER MRN: [id number] Date of Birth: 08-16-1951 No data recorded  Encounter Date: 07/19/2020   PT End of Session - 07/19/20 0918    Visit Number 3    Number of Visits 17    Date for PT Re-Evaluation 08/08/20    PT Start Time 0905    PT Stop Time 0945    PT Time Calculation (min) 40 min    Activity Tolerance Patient tolerated treatment well    Behavior During Therapy [facility] for tasks assessed/performed           Past Medical History:  Diagnosis Date  . Asthma     Past Surgical History:  Procedure Laterality Date  . HERNIA REPAIR  2013  . KNEE SURGERY  1990    There were no vitals filed for this visit.   Subjective Assessment - 07/19/20 0907    Subjective Pt reports that it only helps "when he is needled, then rests". Reports he had pain when he resumed picking up things and loading for work. Reports 2/10 pain this am. Reports compliance with HEP.    Pertinent History Pt is a 69 year old male with R shoulder and scapular pain that has been on and off for many years, but it flared up again about 60months ago. Patient states he is back at the gym and working out regularly. 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 Patient reports the pain at worst can get up to 10/10 and the best it gets is a 2/10, and that is usually in the mornings. Pt denies N/V, B&B changes, saddle paresthesia, fever, night sweats, or unexplained weight loss.    Limitations House hold activities;Lifting    How long can you sit comfortably? unimited    How long can you stand comfortably? unlimited    How long can you walk comfortably? unlimited    Diagnostic tests Xray 06/04/19 unremarkable, arthritis in the joint    Patient  Stated Goals Decrease pain    Pain Onset More than a month ago           Manual STM withtrigger point releaseto R mid trap fibers, rhomboids, and levator, with concordant sign Following:Dry Needling: (3)67mm .30needles placed along the R mid trap/rhomboids/levatorto decrease increased muscular spasms and trigger points with the patient positioned in supine. Patient was educated on risks and benefits of therapy and verbally consents to PT.  G3 T1-7 CPA for increased ext 30sec bout each segment  Ther-Ex - Seated thoracic ext over foam roll (hands behind head) x12 2-3sec hold with cuing for breath control with good carry over - Theraball rollouts (modified childs post) 12x 10sec w/ blue ball for increased thoracic ext for rollout, min cuing for breath control with good carry over - Cobra stretch x12 with breath control, cuing for technique with noted decreased mobility in this position  - Open book x12 bilat with cuing to keep knee contact to mat table Nustep seat 10 UE 10 no resistance for protraction/retraction mobility 29mins Doorway pec stretch 60min UT stretch 30sec Levator stretch 30sec        PT Education - 07/19/20 0918    Education Details therex form/technique, TDN    Person(s) Educated Patient    Methods Explanation;Demonstration;Verbal  cues    Comprehension Verbalized understanding;Returned demonstration;Verbal cues required            PT Short Term Goals - 07/11/20 1650      PT SHORT TERM GOAL #1   Title Pt will be independent with HEP in order to improve strength and decrease pain in order to improve pain-free function at home and work.    Baseline 07/11/20 HEP given    Time 4    Period Weeks    Status New    Target Date 08/08/20             PT Long Term Goals - 07/11/20 1651      PT LONG TERM GOAL #1   Title Patient will increase FOTO score to 74 to demonstrate predicted increase in functional mobility to complete ADLs    Baseline 07/12/20 57     Time 8    Period Weeks    Status New      PT LONG TERM GOAL #2   Title Pt will decrease worst pain as reported on NPRS by at least 3 points in order to demonstrate clinically significant reduction in pain.    Baseline 07/11/20 10/10 at worst    Time 8    Period Weeks    Status New      PT LONG TERM GOAL #3   Title Pt will increase periscapular strength to 5/5 in order to demonstrate improvement in strength and be able to complete overhead activities without difficulty.    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    Baseline 07/11/20 rounded shoulders, forward head, thoracic kyphosis    Time 8    Period Weeks    Status New                 Plan - 07/19/20 0786    Clinical Impression Statement PT adjusted session for increased mobility without resistance training d/t pt preference of completing this at the gym. Patient responds well to manual techniques with decreased pain to 0/10 following. Patient is able to complete all mobility focused therex with proper techniques following minimal cuing for correction. PT continued postural education with verbalized understanding. At end of session pt mentions that he is lifting boxes for work, PT will address lifting mechanics to ensure safety next session. PT will contiue progression as able.    Personal Factors and Comorbidities Age;Comorbidity 1;Fitness;Profession    Comorbidities asthma    Examination-Activity Limitations Sit;Lift;Carry;Reach Overhead    Examination-Participation Restrictions Community Activity;Driving    Stability/Clinical Decision Making Evolving/Moderate complexity    Clinical Decision Making Moderate    Rehab Potential Good    PT Frequency 2x / week    PT Duration 8 weeks    PT  Treatment/Interventions ADLs/Self Care Home Management;Iontophoresis 4mg /ml Dexamethasone;Electrical Stimulation;Functional mobility training;Neuromuscular re-education;Taping;Spinal Manipulations;Joint Manipulations;Vestibular;Passive range of motion;Dry needling;Ultrasound;Traction;Cryotherapy;Moist Heat;Therapeutic exercise;Therapeutic activities;Patient/family education;Manual techniques    PT Next Visit Plan dry needling, postural exercises, stretching, LIFTING ASSESSMENT    PT Home Exercise Plan cervical and scapular retraction, pec stretch, trap stretch    Consulted and Agree with Plan of Care Patient           Patient will benefit from skilled therapeutic  intervention in order to improve the following deficits and impairments:  Decreased endurance, Decreased mobility, Hypomobility, Increased muscle spasms, Impaired sensation, Decreased activity tolerance, Decreased strength, Increased fascial restricitons, Impaired flexibility, Impaired UE functional use, Postural dysfunction, Pain, Improper body mechanics, Impaired tone, Decreased range of motion  Visit Diagnosis: Chronic right shoulder pain  Stiffness of right shoulder, not elsewhere classified  Cervicalgia     Problem List There are no problems to display for this patient.  Durwin Reges DPT Durwin Reges 07/19/2020, 10:26 AM  [place] [facility] [address] [place], [address] Phone: [phone]   Fax:  [phone]  Name: TREYVONE CHELF MRN: [id number] Date of Birth: 1951/01/10

## 2020-07-20 ENCOUNTER — Ambulatory Visit: Payer: PPO | Admitting: Physical Therapy

## 2020-07-27 ENCOUNTER — Ambulatory Visit: Payer: PPO | Attending: Family Medicine | Admitting: Physical Therapy

## 2020-07-27 ENCOUNTER — Encounter: Payer: Self-pay | Admitting: Physical Therapy

## 2020-07-27 ENCOUNTER — Other Ambulatory Visit: Payer: Self-pay

## 2020-07-27 DIAGNOSIS — M25511 Pain in right shoulder: Secondary | ICD-10-CM | POA: Diagnosis not present

## 2020-07-27 DIAGNOSIS — G8929 Other chronic pain: Secondary | ICD-10-CM | POA: Insufficient documentation

## 2020-07-27 DIAGNOSIS — M542 Cervicalgia: Secondary | ICD-10-CM | POA: Insufficient documentation

## 2020-07-27 DIAGNOSIS — M25611 Stiffness of right shoulder, not elsewhere classified: Secondary | ICD-10-CM | POA: Insufficient documentation

## 2020-07-27 DIAGNOSIS — M5413 Radiculopathy, cervicothoracic region: Secondary | ICD-10-CM | POA: Insufficient documentation

## 2020-07-27 NOTE — Therapy (Signed)
[place] [facility] [address] [place], [address] Phone: [phone]   Fax:  [phone]  Physical Therapy Treatment  Patient Details  Name: Tyler Gregory MRN: [id number] Date of Birth: 07/20/1951 No data recorded  Encounter Date: 07/27/2020   PT End of Session - 07/27/20 0838    Visit Number 4    Number of Visits 17    Date for PT Re-Evaluation 08/08/20    PT Start Time 0820    PT Stop Time 0900    PT Time Calculation (min) 40 min    Activity Tolerance Patient tolerated treatment well    Behavior During Therapy [facility] for tasks assessed/performed           Past Medical History:  Diagnosis Date  . Asthma     Past Surgical History:  Procedure Laterality Date  . HERNIA REPAIR  2013  . KNEE SURGERY  1990    There were no vitals filed for this visit.   Subjective Assessment - 07/27/20 0825    Subjective Pt reports continued pain with work, but that he is noticing his posture more and trying to work on it. Compliance with HEP    Pertinent History Pt is a 69 year old male with R shoulder and scapular pain that has been on and off for many years, but it flared up again about 34months ago. Patient states he is back at the gym and working out regularly. 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 Patient reports the pain at worst can get up to 10/10 and the best it gets is a 2/10, and that is usually in the mornings. Pt denies N/V, B&B changes, saddle paresthesia, fever, night sweats, or unexplained weight loss.    Limitations House hold activities;Lifting    How long can you sit comfortably? unimited    How long can you stand comfortably? unlimited    How long can you walk comfortably? unlimited    Diagnostic tests Xray 06/04/19 unremarkable, arthritis in the joint    Patient Stated Goals Decrease pain    Pain Onset More than a month ago            Manual STM withtrigger point releaseto R mid trap fibers, rhomboids, and levator, with concordant sign Following:Dry Needling: (2/1/1)86mm .30needles placed along the R mid trap/rhomboids, levator, and UT (bilat) with pincer to decrease increased muscular spasms and trigger points with the patient positioned in prone. Patient was educated on risks and benefits of therapy and verbally consents to PT.  G3 T1-7 CPA for increased ext 30sec bout each segment  Ther-Ex -Seated thoracic ext over foam roll (hands behind head) x12 2-3sec hold with cuing for breath control with good carry over - Theraball rollouts (modified childs post) 12x 10secw/ blue ball for increased thoracic ext for rollout, min cuing for breath control with good carry over - Cobra stretch x12 with breath control, cuing for technique with noted decreased mobility in this position  - Squat assessment with correction, initially reaching toward box with thoracic flexion and minimal knee/hip flex, education on squatting technique for safety and use of glutes as "driver" of movement and upper body as the "stabilizer" with good understanding.  - Thoracic ext with scap retraction in prone 2x 8 with min cuing  Nustep seat 10 UE 10 no resistance for protraction/retraction mobility 74mins Doorway pec stretch 30sec Levator stretch 30sec UT  stretch 30sec bilat    PT Education - 07/27/20 0836    Education Details therex form/technique, TDN    Person(s) Educated Patient    Methods Explanation;Demonstration;Verbal cues    Comprehension Verbalized understanding;Returned demonstration;Verbal cues required            PT Short Term Goals - 07/11/20 1650      PT SHORT TERM GOAL #1   Title Pt will be independent with HEP in order to improve strength and decrease pain in order to improve pain-free function at home and work.    Baseline 07/11/20 HEP given    Time 4    Period Weeks    Status New    Target Date 08/08/20              PT Long Term Goals - 07/11/20 1651      PT LONG TERM GOAL #1   Title Patient will increase FOTO score to 74 to demonstrate predicted increase in functional mobility to complete ADLs    Baseline 07/12/20 57    Time 8    Period Weeks    Status New      PT LONG TERM GOAL #2   Title Pt will decrease worst pain as reported on NPRS by at least 3 points in order to demonstrate clinically significant reduction in pain.    Baseline 07/11/20 10/10 at worst    Time 8    Period Weeks    Status New      PT LONG TERM GOAL #3   Title Pt will increase periscapular strength to 5/5 in order to demonstrate improvement in strength and be able to complete overhead activities without difficulty.    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    Baseline 07/11/20 rounded shoulders, forward head, thoracic kyphosis    Time 8    Period Weeks    Status New                 Plan - 07/27/20 0849    Clinical Impression Statement PT continued to utilize manual techniques with TDN to decrease muscle tension and pain with success. PT continued to utilize therex for increased mobility and postural correction. PT reviewed squatting/lifting mechanics for safety and use of LEs with squat as opposed to forward reaching and compensations. Patient is abe to demonstrate good carry over and understanding. PT will continue progression as abel.    Personal Factors and Comorbidities Age;Comorbidity 1;Fitness;Profession    Comorbidities asthma    Examination-Activity Limitations Sit;Lift;Carry;Reach Overhead    Examination-Participation Restrictions Community Activity;Driving    Stability/Clinical Decision Making Evolving/Moderate complexity    Clinical Decision Making Moderate    Rehab Potential  Good    PT Frequency 2x / week    PT Duration 8 weeks    PT Treatment/Interventions ADLs/Self Care Home Management;Iontophoresis 4mg /ml Dexamethasone;Electrical Stimulation;Functional mobility training;Neuromuscular re-education;Taping;Spinal Manipulations;Joint Manipulations;Vestibular;Passive range of motion;Dry needling;Ultrasound;Traction;Cryotherapy;Moist Heat;Therapeutic exercise;Therapeutic activities;Patient/family education;Manual techniques    PT Next Visit Plan dry needling, postural exercises, stretching,    PT Home Exercise Plan cervical and scapular retraction, pec stretch, trap stretch    Consulted and Agree with Plan of Care Patient  Patient will benefit from skilled therapeutic intervention in order to improve the following deficits and impairments:  Decreased endurance, Decreased mobility, Hypomobility, Increased muscle spasms, Impaired sensation, Decreased activity tolerance, Decreased strength, Increased fascial restricitons, Impaired flexibility, Impaired UE functional use, Postural dysfunction, Pain, Improper body mechanics, Impaired tone, Decreased range of motion  Visit Diagnosis: Chronic right shoulder pain  Stiffness of right shoulder, not elsewhere classified  Cervicalgia     Problem List There are no problems to display for this patient.  Tyler Gregory DPT Tyler Gregory 07/27/2020, 10:29 AM  [place] [facility] [address] [place], [address] Phone: [phone]   Fax:  [phone]  Name: Tyler Gregory MRN: [id number] Date of Birth: [birth year]

## 2020-07-29 ENCOUNTER — Encounter: Payer: Self-pay | Admitting: Physical Therapy

## 2020-07-29 ENCOUNTER — Telehealth: Payer: Self-pay

## 2020-07-29 ENCOUNTER — Other Ambulatory Visit: Payer: Self-pay

## 2020-07-29 ENCOUNTER — Ambulatory Visit: Payer: PPO | Admitting: Physical Therapy

## 2020-07-29 DIAGNOSIS — M5413 Radiculopathy, cervicothoracic region: Secondary | ICD-10-CM

## 2020-07-29 DIAGNOSIS — M25611 Stiffness of right shoulder, not elsewhere classified: Secondary | ICD-10-CM

## 2020-07-29 DIAGNOSIS — M25511 Pain in right shoulder: Secondary | ICD-10-CM | POA: Diagnosis not present

## 2020-07-29 DIAGNOSIS — G8929 Other chronic pain: Secondary | ICD-10-CM

## 2020-07-29 DIAGNOSIS — M542 Cervicalgia: Secondary | ICD-10-CM

## 2020-07-29 NOTE — Therapy (Signed)
[place] [facility] [address] [place], [address] Phone: [phone]   Fax:  [phone]  Physical Therapy Treatment  Patient Details  Name: Tyler Gregory MRN: [id number] Date of Birth: 20-Dec-1950 No data recorded  Encounter Date: 07/29/2020   PT End of Session - 07/29/20 0918    Visit Number 5    Number of Visits 17    Date for PT Re-Evaluation 08/08/20    PT Start Time 0836    PT Stop Time 0849    PT Time Calculation (min) 13 min    Activity Tolerance Patient tolerated treatment well    Behavior During Therapy [facility] for tasks assessed/performed           Past Medical History:  Diagnosis Date  . Asthma     Past Surgical History:  Procedure Laterality Date  . HERNIA REPAIR  2013  . KNEE SURGERY  1990    There were no vitals filed for this visit.   Subjective Assessment - 07/29/20 0912    Subjective PT reports he has to leave quickly this session d/t work, would like to complete manual techniques to follow with HEP. Reports tension at UT/levator is better, maintained tension and pain at midtraps    Pertinent History Pt is a 69 year old male with R shoulder and scapular pain that has been on and off for many years, but it flared up again about 67months ago. Patient states he is back at the gym and working out regularly. 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 Patient reports the pain at worst can get up to 10/10 and the best it gets is a 2/10, and that is usually in the mornings. Pt denies N/V, B&B changes, saddle paresthesia, fever, night sweats, or unexplained weight loss.    Limitations House hold activities;Lifting    How long can you sit comfortably? unimited    How long can you stand comfortably? unlimited    How long can you walk comfortably? unlimited    Diagnostic tests Xray 06/04/19 unremarkable, arthritis in the joint    Patient  Stated Goals Decrease pain    Pain Onset More than a month ago           Manual STM withtrigger point releaseto R mid trap fibers, rhomboids, and levator, with concordant sign at midtrap fibers Following:Dry Needling: (3)43mm .30needles placed along the R mid trap/rhomboids with pincer to decrease increased muscular spasms and trigger points with the patient positioned in prone. Patient was educated on risks and benefits of therapy and verbally consents to PT. G3 T1-7 CPA for increased ext 30sec bout each segment                   PT Education - 07/29/20 0916    Education Details TDN,    Person(s) Educated Patient    Methods Explanation;Demonstration;Verbal cues    Comprehension Verbalized understanding;Returned demonstration;Verbal cues required            PT Short Term Goals - 07/11/20 1650      PT SHORT TERM GOAL #1   Title Pt will be independent with HEP in order to improve strength and decrease pain in order to improve pain-free function at home and work.    Baseline 07/11/20 HEP given    Time 4    Period Weeks    Status New    Target  Date 08/08/20             PT Long Term Goals - 07/11/20 1651      PT LONG TERM GOAL #1   Title Patient will increase FOTO score to 74 to demonstrate predicted increase in functional mobility to complete ADLs    Baseline 07/12/20 57    Time 8    Period Weeks    Status New      PT LONG TERM GOAL #2   Title Pt will decrease worst pain as reported on NPRS by at least 3 points in order to demonstrate clinically significant reduction in pain.    Baseline 07/11/20 10/10 at worst    Time 8    Period Weeks    Status New      PT LONG TERM GOAL #3   Title Pt will increase periscapular strength to 5/5 in order to demonstrate improvement in strength and be able to complete overhead activities without difficulty.    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    Baseline 07/11/20 rounded shoulders, forward head, thoracic kyphosis    Time 8    Period Weeks    Status New                 Plan - 07/29/20 0919    Clinical Impression Statement PT continued to utilize manual techniques with TDN to relieve tension at midtrap/rhomboid group (concorant pain sign). Maintained decreased tension in UT and levator. PT will continue progression as able.    Personal Factors and Comorbidities Age;Comorbidity 1;Fitness;Profession    Comorbidities asthma    Examination-Activity Limitations Sit;Lift;Carry;Reach Overhead    Examination-Participation Restrictions Community Activity;Driving    Stability/Clinical Decision Making Evolving/Moderate complexity    Clinical Decision Making Moderate    Rehab Potential Good    PT Frequency 2x / week    PT Duration 8 weeks    PT Treatment/Interventions ADLs/Self Care Home Management;Iontophoresis 4mg /ml Dexamethasone;Electrical Stimulation;Functional mobility training;Neuromuscular re-education;Taping;Spinal Manipulations;Joint Manipulations;Vestibular;Passive range of motion;Dry needling;Ultrasound;Traction;Cryotherapy;Moist Heat;Therapeutic exercise;Therapeutic activities;Patient/family education;Manual techniques    PT Next Visit Plan dry needling, postural exercises, stretching,    PT Home Exercise Plan cervical and scapular retraction, pec stretch, trap stretch    Consulted and Agree with Plan of Care Patient           Patient will benefit from skilled therapeutic intervention in order to improve the following deficits and impairments:  Decreased endurance, Decreased mobility, Hypomobility, Increased muscle spasms, Impaired sensation, Decreased activity tolerance, Decreased strength, Increased fascial restricitons, Impaired flexibility, Impaired UE  functional use, Postural dysfunction, Pain, Improper body mechanics, Impaired tone, Decreased range of motion  Visit Diagnosis: Chronic right shoulder pain  Stiffness of right shoulder, not elsewhere classified  Cervicalgia  Radiculopathy, cervicothoracic region     Problem List There are no problems to display for this patient.  Durwin Reges DPT Durwin Reges 07/29/2020, 9:28 AM  [place] [facility] [address] [place], [address] Phone: [phone]   Fax:  [phone]  Name: Tyler Gregory MRN: [id number] Date of Birth: 12-28-1950

## 2020-07-29 NOTE — Telephone Encounter (Signed)
Returned patients call. Pt requested to cancel procedure due to traveling issues with work. Pt states he will call the first of the year to reschedule.

## 2020-08-01 ENCOUNTER — Ambulatory Visit: Payer: PPO | Admitting: Physical Therapy

## 2020-08-03 ENCOUNTER — Ambulatory Visit: Payer: PPO | Admitting: Physical Therapy

## 2020-08-05 ENCOUNTER — Encounter: Payer: PPO | Admitting: Physical Therapy

## 2020-08-08 ENCOUNTER — Encounter: Payer: PPO | Admitting: Family Medicine

## 2020-08-10 ENCOUNTER — Ambulatory Visit: Payer: PPO | Admitting: Physical Therapy

## 2020-08-11 ENCOUNTER — Ambulatory Visit: Admit: 2020-08-11 | Payer: PPO | Admitting: Gastroenterology

## 2020-08-11 SURGERY — COLONOSCOPY WITH PROPOFOL
Anesthesia: General

## 2020-08-12 ENCOUNTER — Other Ambulatory Visit: Payer: Self-pay

## 2020-08-12 ENCOUNTER — Ambulatory Visit: Payer: PPO | Admitting: Physical Therapy

## 2020-08-12 ENCOUNTER — Encounter: Payer: Self-pay | Admitting: Physical Therapy

## 2020-08-12 DIAGNOSIS — M25611 Stiffness of right shoulder, not elsewhere classified: Secondary | ICD-10-CM

## 2020-08-12 DIAGNOSIS — M5413 Radiculopathy, cervicothoracic region: Secondary | ICD-10-CM

## 2020-08-12 DIAGNOSIS — M542 Cervicalgia: Secondary | ICD-10-CM

## 2020-08-12 DIAGNOSIS — M25511 Pain in right shoulder: Secondary | ICD-10-CM | POA: Diagnosis not present

## 2020-08-12 DIAGNOSIS — G8929 Other chronic pain: Secondary | ICD-10-CM

## 2020-08-12 NOTE — Therapy (Signed)
[facility] [address], [place], [address] Phone: [phone]   Fax:  [phone]  Physical Therapy Treatment  Patient Details  Name: Tyler Gregory MRN: [id number] Date of Birth: 03/06/1951 No data recorded  Encounter Date: 08/12/2020   PT End of Session - 08/12/20 0826    Visit Number 6    Number of Visits 17    Date for PT Re-Evaluation 08/08/20    PT Start Time 0800    PT Stop Time 0815    PT Time Calculation (min) 15 min    Activity Tolerance Patient tolerated treatment well    Behavior During Therapy [facility] for tasks assessed/performed           Past Medical History:  Diagnosis Date   Asthma     Past Surgical History:  Procedure Laterality Date   HERNIA REPAIR  2013   KNEE SURGERY  1990    There were no vitals filed for this visit.   Subjective Assessment - 08/12/20 0801    Subjective Patient reports he has an appt in [facility] today and would like to just complete manual techniques so he can make that appt. Reports he is continuing to have pain, today 3/10 and his posture is getting better. Pain is slowly getting a little better.    Pertinent History Pt is a 69 year old male with R shoulder and scapular pain that has been on and off for many years, but it flared up again about 31months ago. Patient states he is back at the gym and working out regularly. 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 Patient reports the pain at worst can get up to 10/10 and the best it gets is a 2/10, and that is usually in the mornings. Pt denies N/V, B&B changes, saddle paresthesia, fever, night sweats, or unexplained weight loss.    Limitations House hold activities;Lifting    How long can you sit comfortably? unimited    How long can you stand comfortably? unlimited    How long can you walk comfortably? unlimited    Diagnostic tests Xray  06/04/19 unremarkable, arthritis in the joint    Pain Onset More than a month ago             Manual STM withtrigger point releaseto R mid trap fibers, rhomboids, and levator, with concordant sign Following:Dry Needling: (2/1/1)84mm .30needles placed along the R mid trap/rhomboids, levator, and UT (bilat) with pincer to decrease increased muscular spasms and trigger points with the patient positioned in prone. Patient was educated on risks and benefits of therapy and verbally consents to PT. G3 T1-7 CPA for increased ext 30sec bout each segment         PT Education - 08/12/20 0810    Education Details TDN    Person(s) Educated Patient    Methods Explanation;Demonstration;Verbal cues    Comprehension Verbalized understanding;Returned demonstration;Verbal cues required            PT Short Term Goals - 07/11/20 1650      PT SHORT TERM GOAL #1   Title Pt will be independent with HEP in order to improve strength and decrease pain in order to improve pain-free function at home and work.    Baseline 07/11/20 HEP given    Time 4    Period Weeks    Status New    Target Date 08/08/20  PT Long Term Goals - 07/11/20 1651      PT LONG TERM GOAL #1   Title Patient will increase FOTO score to 74 to demonstrate predicted increase in functional mobility to complete ADLs    Baseline 07/12/20 57    Time 8    Period Weeks    Status New      PT LONG TERM GOAL #2   Title Pt will decrease worst pain as reported on NPRS by at least 3 points in order to demonstrate clinically significant reduction in pain.    Baseline 07/11/20 10/10 at worst    Time 8    Period Weeks    Status New      PT LONG TERM GOAL #3   Title Pt will increase periscapular strength to 5/5 in order to demonstrate improvement in strength and be able to complete overhead activities without difficulty.    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    Baseline 07/11/20 rounded shoulders, forward head, thoracic kyphosis    Time 8    Period Weeks    Status New                 Plan - 08/12/20 7824    Clinical Impression Statement PT utilized manual and TDN techniques to decrease muscle tension. Patient again, is unable to complete full session d/t time constraints. PT educated patient on importance of continuing to complete HEP and for mobility therex, and that TDN/passive techniques alone are not enough to    Personal Factors and Comorbidities Age;Comorbidity 1;Fitness;Profession    Comorbidities asthma    Examination-Activity Limitations Sit;Lift;Carry;Reach Overhead    Examination-Participation Restrictions Community Activity;Driving    Stability/Clinical Decision Making Evolving/Moderate complexity    Clinical Decision Making Moderate    Rehab Potential Good    PT Frequency 2x / week    PT Duration 8 weeks    PT Treatment/Interventions ADLs/Self Care Home Management;Iontophoresis 4mg /ml Dexamethasone;Electrical Stimulation;Functional mobility training;Neuromuscular re-education;Taping;Spinal Manipulations;Joint Manipulations;Vestibular;Passive range of motion;Dry needling;Ultrasound;Traction;Cryotherapy;Moist Heat;Therapeutic exercise;Therapeutic activities;Patient/family education;Manual techniques    PT Next Visit Plan dry needling, postural exercises, stretching,    PT Home Exercise Plan cervical and scapular retraction, pec stretch, trap stretch    Consulted and Agree with Plan of Care Patient           Patient will benefit from skilled therapeutic intervention in order to improve the following deficits and impairments:  Decreased endurance,Decreased mobility,Hypomobility,Increased muscle spasms,Impaired sensation,Decreased activity tolerance,Decreased  strength,Increased fascial restricitons,Impaired flexibility,Impaired UE functional use,Postural dysfunction,Pain,Improper body mechanics,Impaired tone,Decreased range of motion  Visit Diagnosis: Chronic right shoulder pain  Stiffness of right shoulder, not elsewhere classified  Cervicalgia  Radiculopathy, cervicothoracic region     Problem List There are no problems to display for this patient.  Durwin Reges DPT Durwin Reges 08/12/2020, 8:35 AM  [facility] [address] [place], [address] Phone: [phone]   Fax:  [phone]  Name: Tyler Gregory MRN: [id number] Date of Birth: [birth year]

## 2020-08-15 ENCOUNTER — Other Ambulatory Visit: Payer: Self-pay

## 2020-08-15 ENCOUNTER — Ambulatory Visit: Payer: PPO | Admitting: Physical Therapy

## 2020-08-15 ENCOUNTER — Encounter: Payer: Self-pay | Admitting: Physical Therapy

## 2020-08-15 DIAGNOSIS — M542 Cervicalgia: Secondary | ICD-10-CM

## 2020-08-15 DIAGNOSIS — M25611 Stiffness of right shoulder, not elsewhere classified: Secondary | ICD-10-CM

## 2020-08-15 DIAGNOSIS — M25511 Pain in right shoulder: Secondary | ICD-10-CM | POA: Diagnosis not present

## 2020-08-15 DIAGNOSIS — M5413 Radiculopathy, cervicothoracic region: Secondary | ICD-10-CM

## 2020-08-15 NOTE — Therapy (Signed)
[facility] [address] [place], [address] Phone: [phone]   Fax:  [phone]  Physical Therapy Treatment  Patient Details  Name: Tyler Gregory MRN: [id number] Date of Birth: April 15, 1951 No data recorded  Encounter Date: 08/15/2020   PT End of Session - 08/15/20 1047    Visit Number 7    Number of Visits 17    Date for PT Re-Evaluation 08/08/20    PT Start Time 1030    PT Stop Time 1110    PT Time Calculation (min) 40 min    Activity Tolerance Patient tolerated treatment well    Behavior During Therapy [facility] for tasks assessed/performed           Past Medical History:  Diagnosis Date  . Asthma     Past Surgical History:  Procedure Laterality Date  . HERNIA REPAIR  2013  . KNEE SURGERY  1990    There were no vitals filed for this visit.   Subjective Assessment - 08/15/20 1032    Subjective Patient reports he has been feeling better since last visit, since resting and not working. Reports pain today is 3/10.    Pertinent History Pt is a 69 year old male with R shoulder and scapular pain that has been on and off for many years, but it flared up again about 26months ago. Patient states he is back at the gym and working out regularly. 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 Patient reports the pain at worst can get up to 10/10 and the best it gets is a 2/10, and that is usually in the mornings. Pt denies N/V, B&B changes, saddle paresthesia, fever, night sweats, or unexplained weight loss.    Limitations House hold activities;Lifting    How long can you sit comfortably? unimited    How long can you stand comfortably? unlimited    How long can you walk comfortably? unlimited    Diagnostic tests Xray 06/04/19 unremarkable, arthritis in the joint    Patient Stated Goals Decrease pain    Pain Onset More than a month ago            Manual STM withtrigger point releaseto R mid trap fibers, rhomboids, and levator, with concordant sign Following:Dry Needling: (2/1/1)61mm .30needles placed along the R mid trap/rhomboids, levator, and UT (bilat) with pincer to decrease increased muscular spasms and trigger points with the patient positioned in prone. Patient was educated on risks and benefits of therapy and verbally consents to PT. G3 T1-7 CPA for increased ext 30sec bout each segment  Ther-Ex -Seated thoracic ext over foam roll (hands behind head) x12 2-3sec hold with cuing for breath control with good carry over - Cobra stretch x12 with breath control, cuing for technique with noted decreased mobility in this position  -Thoracic ext with scap retraction in prone 2x 8 with min cuing  Seated rows 35# x10; 45# 2 x10  with cuing for cervical posture and prevention of over protraction on eccentric Nustep seat 10 UE 10 no resistance for protraction/retraction mobility 59mins - Theraball rollouts (modified childs post) 12x 10secw/ blue ball for increased thoracic ext for rollout, min cuing for breath control with good carry over       PT Education - 08/15/20 1044    Education Details TDN, therex form/technique    Person(s) Educated Patient    Methods Explanation;Demonstration;Verbal cues  Comprehension Verbalized understanding;Returned demonstration;Verbal cues required            PT Short Term Goals - 07/11/20 1650      PT SHORT TERM GOAL #1   Title Pt will be independent with HEP in order to improve strength and decrease pain in order to improve pain-free function at home and work.    Baseline 07/11/20 HEP given    Time 4    Period Weeks    Status New    Target Date 08/08/20             PT Long Term Goals - 07/11/20 1651      PT LONG TERM GOAL #1   Title Patient will increase FOTO score to 74 to demonstrate predicted increase in functional mobility to complete ADLs    Baseline 07/12/20 57     Time 8    Period Weeks    Status New      PT LONG TERM GOAL #2   Title Pt will decrease worst pain as reported on NPRS by at least 3 points in order to demonstrate clinically significant reduction in pain.    Baseline 07/11/20 10/10 at worst    Time 8    Period Weeks    Status New      PT LONG TERM GOAL #3   Title Pt will increase periscapular strength to 5/5 in order to demonstrate improvement in strength and be able to complete overhead activities without difficulty.    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    Baseline 07/11/20 rounded shoulders, forward head, thoracic kyphosis    Time 8    Period Weeks    Status New                 Plan - 08/15/20 1050    Clinical Impression Statement PT continued to utilize manual and TDN to reduce muscle tension and pain, with therex progression for postural strengthening and mobility with success. Patient is able to comply with all cuing for proper technique of therex and posture with good motivation throughout session. PT will continue progression as able.    Personal Factors and Comorbidities Age;Comorbidity 1;Fitness;Profession    Comorbidities asthma    Examination-Activity Limitations Sit;Lift;Carry;Reach Overhead    Examination-Participation Restrictions Community Activity;Driving    Stability/Clinical Decision Making Evolving/Moderate complexity    Clinical Decision Making Moderate    Rehab Potential Good    PT Frequency 2x / week    PT Duration 8 weeks    PT Treatment/Interventions ADLs/Self Care Home Management;Iontophoresis 4mg /ml Dexamethasone;Electrical Stimulation;Functional mobility training;Neuromuscular re-education;Taping;Spinal Manipulations;Joint Manipulations;Vestibular;Passive range of motion;Dry  needling;Ultrasound;Traction;Cryotherapy;Moist Heat;Therapeutic exercise;Therapeutic activities;Patient/family education;Manual techniques    PT Next Visit Plan dry needling, postural exercises, stretching,    PT Home Exercise Plan cervical and scapular retraction, pec stretch, trap stretch    Consulted and Agree with Plan of Care Patient           Patient will benefit from skilled therapeutic intervention in order to improve the following deficits and impairments:  Decreased endurance,Decreased mobility,Hypomobility,Increased muscle spasms,Impaired sensation,Decreased activity tolerance,Decreased strength,Increased fascial restricitons,Impaired flexibility,Impaired UE functional use,Postural dysfunction,Pain,Improper body mechanics,Impaired tone,Decreased range of motion  Visit Diagnosis: Chronic right shoulder pain  Stiffness of right shoulder, not elsewhere classified  Cervicalgia  Radiculopathy, cervicothoracic region     Problem List There are no problems to display for this patient.  Tyler Gregory DPT Tyler Gregory 08/15/2020, 11:05 AM  [facility] [address], [place], [address] Phone: [phone]   Fax:  [phone]  Name: Tyler Gregory MRN: [id number] Date of Birth: 04-07-1951

## 2020-08-17 ENCOUNTER — Encounter: Payer: PPO | Admitting: Physical Therapy

## 2020-08-18 ENCOUNTER — Ambulatory Visit (INDEPENDENT_AMBULATORY_CARE_PROVIDER_SITE_OTHER): Payer: PPO | Admitting: Family Medicine

## 2020-08-18 ENCOUNTER — Encounter: Payer: Self-pay | Admitting: Family Medicine

## 2020-08-18 ENCOUNTER — Other Ambulatory Visit: Payer: Self-pay

## 2020-08-18 VITALS — BP 138/83 | HR 81 | Temp 98.9°F | Resp 16 | Ht 72.0 in | Wt 226.0 lb

## 2020-08-18 DIAGNOSIS — Z125 Encounter for screening for malignant neoplasm of prostate: Secondary | ICD-10-CM | POA: Diagnosis not present

## 2020-08-18 DIAGNOSIS — Z1159 Encounter for screening for other viral diseases: Secondary | ICD-10-CM | POA: Diagnosis not present

## 2020-08-18 DIAGNOSIS — Z8709 Personal history of other diseases of the respiratory system: Secondary | ICD-10-CM | POA: Diagnosis not present

## 2020-08-18 DIAGNOSIS — Z0000 Encounter for general adult medical examination without abnormal findings: Secondary | ICD-10-CM

## 2020-08-18 DIAGNOSIS — Z13220 Encounter for screening for lipoid disorders: Secondary | ICD-10-CM | POA: Diagnosis not present

## 2020-08-18 DIAGNOSIS — Z23 Encounter for immunization: Secondary | ICD-10-CM | POA: Diagnosis not present

## 2020-08-18 NOTE — Progress Notes (Signed)
Complete physical exam   Patient: Tyler Gregory   DOB: 1951-02-16   69 y.o. Male  MRN: [id number] Visit Date: 08/18/2020  Today's healthcare provider: Dortha Kern, PA-C   Chief Complaint  Patient presents with   Annual Exam   Subjective    Tyler Gregory is a 69 y.o. male who presents today for a complete physical exam.  He reports consuming a general diet. The patient has a physically strenuous job, but has no regular exercise apart from work.  He generally feels fairly well. He reports sleeping fairly well. He does not have additional problems to discuss today.   Had AWV with [facility] on 07/11/2020.   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  Tobacco Use   Smoking status: Former Smoker    Types: Cigarettes   Smokeless tobacco: Never Used   Tobacco comment: on and off; quit around 2000  Vaping Use   Vaping Use: Never used  Substance and Sexual Activity   Alcohol use: Yes    Alcohol/week: 12.0 standard drinks    Types: 12 Cans of beer per week    Comment: 0-2 beers a day   Drug use: Not Currently   Sexual activity: Not on file  Other Topics Concern   Not on file  Social History Narrative   Not on file   Social Determinants of Health   Financial Resource Strain: Low Risk    Difficulty of Paying Living Expenses: Not hard at all  Food Insecurity: No Food Insecurity   Worried About Programme researcher, broadcasting/film/video in the Last Year: Never true   Ran Out of Food in the Last Year: Never true  Transportation Needs: No Transportation Needs   Lack of Transportation  (Medical): No   Lack of Transportation (Non-Medical): No  Physical Activity: Insufficiently Active   Days of Exercise per Week: 3 days   Minutes of Exercise per Session: 40 min  Stress: Stress Concern Present   Feeling of Stress : Rather much  Social Connections: Socially Isolated   Frequency of Communication with Friends and Family: More than three times a week   Frequency of Social Gatherings with Friends and Family: More than three times a week   Attends Religious Services: Never   Database administrator or Organizations: No   Attends Engineer, structural: Never   Marital Status: Divorced  Catering manager Violence: Not At Risk   Fear of Current or Ex-Partner: No   Emotionally Abused: No   Physically Abused: No   Sexually Abused: No   Family Status  Relation Name Status   Mother  Deceased   Father  Deceased   Sister  Alive   Son  Alive   Family History  Problem Relation Age of Onset   Breast cancer Mother    Emphysema Father    Allergies  Allergen Reactions   Shrimp [Shellfish Allergy] Swelling    Patient Care Team: Daxtin Leiker, Maryjean Morn as PCP -  General (Family Medicine) Arelia Sneddon, OD (Optometry)   Medications: Outpatient Medications Prior to Visit  Medication Sig   acetaminophen (TYLENOL) 325 MG tablet Take 650 mg by mouth every 6 (six) hours as needed.   albuterol (VENTOLIN HFA) 108 (90 Base) MCG/ACT inhaler Inhale 2 puffs into the lungs every 6 (six) hours as needed for wheezing or shortness of breath.   fluticasone (FLONASE) 50 MCG/ACT nasal spray Place 1 spray into both nostrils daily. As needed   No facility-administered medications prior to visit.    Review of Systems  Constitutional: Negative for appetite change, chills, fatigue and fever.  HENT: Negative for congestion, ear pain, hearing loss, nosebleeds and trouble swallowing.   Eyes: Negative for pain and visual disturbance.  Respiratory: Negative for cough, chest tightness and  shortness of breath.   Cardiovascular: Negative for chest pain, palpitations and leg swelling.  Gastrointestinal: Negative for abdominal pain, blood in stool, constipation, diarrhea, nausea and vomiting.  Endocrine: Negative for polydipsia, polyphagia and polyuria.  Genitourinary: Negative for dysuria and flank pain.  Musculoskeletal: Positive for neck pain. Negative for arthralgias, back pain, joint swelling, myalgias and neck stiffness.  Skin: Negative for color change, rash and wound.  Allergic/Immunologic: Positive for environmental allergies.  Neurological: Negative for dizziness, tremors, seizures, speech difficulty, weakness, light-headedness and headaches.  Psychiatric/Behavioral: Negative for behavioral problems, confusion, decreased concentration, dysphoric mood and sleep disturbance. The patient is not nervous/anxious.   All other systems reviewed and are negative.      Objective    BP 138/83 (BP Location: Right Arm, Patient Position: Sitting, Cuff Size: Large)   Pulse 81   Temp 98.9 F (37.2 C) (Temporal)   Resp 16   Ht 6' (1.829 m)   Wt 226 lb (102.5 kg)   BMI 30.65 kg/m  BP Readings from Last 3 Encounters:  08/18/20 138/83  06/14/20 124/69  12/17/19 130/82   Wt Readings from Last 3 Encounters:  08/18/20 226 lb (102.5 kg)  06/14/20 225 lb (102.1 kg)  12/17/19 227 lb (103 kg)      Physical Exam    Last depression screening scores PHQ 2/9 Scores 07/11/2020 12/17/2019 08/21/2018  PHQ - 2 Score 0 0 0   Last fall risk screening Fall Risk  07/11/2020  Falls in the past year? 0  Number falls in past yr: 0  Injury with Fall? 0   Last Audit-C alcohol use screening Alcohol Use Disorder Test (AUDIT) 07/11/2020  1. How often do you have a drink containing alcohol? 4  2. How many drinks containing alcohol do you have on a typical day when you are drinking? 0  3. How often do you have six or more drinks on one occasion? 0  AUDIT-C Score 4  4. How often during the  last year have you found that you were not able to stop drinking once you had started? 0  5. How often during the last year have you failed to do what was normally expected from you because of drinking? 0  6. How often during the last year have you needed a first drink in the morning to get yourself going after a heavy drinking session? 0  7. How often during the last year have you had a feeling of guilt of remorse after drinking? 0  8. How often during the last year have you been unable to remember what happened the night before because you had been drinking? 0  9. Have you or someone else been injured as  a result of your drinking? 0  10. Has a relative or friend or a doctor or another health worker been concerned about your drinking or suggested you cut down? 0  Alcohol Use Disorder Identification Test Final Score (AUDIT) 4  Alcohol Brief Interventions/Follow-up AUDIT Score <7 follow-up not indicated   A score of 3 or more in women, and 4 or more in men indicates increased risk for alcohol abuse, EXCEPT if all of the points are from question 1   No results found for any visits on 08/18/20.  Assessment & Plan    Routine Health Maintenance and Physical Exam  Exercise Activities and Dietary recommendations Goals      DIET - INCREASE WATER INTAKE     Recommend to drink at least 6-8 8oz glasses of water per day.        Immunization History  Administered Date(s) Administered   Fluad Quad(high Dose 65+) 06/04/2019, 06/14/2020    Health Maintenance  Topic Date Due   Hepatitis C Screening  Never done   COVID-19 Vaccine (1) Never done   COLONOSCOPY  Never done   PNA vac Low Risk Adult (1 of 2 - PCV13) Never done   TETANUS/TDAP  07/11/2021 (Originally 08/19/1971)   INFLUENZA VACCINE  Completed    Discussed health benefits of physical activity, and encouraged him to engage in regular exercise appropriate for his age and condition.  1. Encounter for Medicare annual wellness exam  -  CBC with Differential/Platelet - Comprehensive metabolic panel - Lipid panel - PSA - TSH  2. Need for vaccination against Streptococcus pneumoniae  - Pneumococcal conjugate vaccine 13-valent IM  3. History of asthma  - CBC with Differential/Platelet  4. Screening cholesterol level  - Comprehensive metabolic panel - Lipid panel - TSH  5. Screening PSA (prostate specific antigen)  - PSA  6. Need for hepatitis C screening test  - Hepatitis C antibody    No follow-ups on file.     I, Joeseph Verville, PA-C, have reviewed all documentation for this visit. The documentation on 08/18/20 for the exam, diagnosis, procedures, and orders are all accurate and complete.  Am the supervising physician for Fannie Knee, PA and I am signing off on this unfinished note with no further clinical information Wilhemena Durie., MD    Vernie Murders, PA-C  [facility] [phone] (phone) [phone] (fax)  [place]

## 2020-08-22 ENCOUNTER — Ambulatory Visit: Payer: PPO | Admitting: Physical Therapy

## 2020-08-22 ENCOUNTER — Encounter: Payer: Self-pay | Admitting: Physical Therapy

## 2020-08-22 ENCOUNTER — Other Ambulatory Visit: Payer: Self-pay

## 2020-08-22 DIAGNOSIS — M5413 Radiculopathy, cervicothoracic region: Secondary | ICD-10-CM

## 2020-08-22 DIAGNOSIS — M25611 Stiffness of right shoulder, not elsewhere classified: Secondary | ICD-10-CM

## 2020-08-22 DIAGNOSIS — M542 Cervicalgia: Secondary | ICD-10-CM

## 2020-08-22 DIAGNOSIS — G8929 Other chronic pain: Secondary | ICD-10-CM

## 2020-08-22 DIAGNOSIS — M25511 Pain in right shoulder: Secondary | ICD-10-CM | POA: Diagnosis not present

## 2020-08-22 NOTE — Therapy (Signed)
[facility] [address] [place], [address] Phone: [phone]   Fax:  [phone]  Physical Therapy Treatment  Patient Details  Name: Tyler Gregory MRN: [id number] Date of Birth: [birth year] No data recorded  Encounter Date: 08/22/2020   PT End of Session - 08/22/20 1120    Visit Number 8    Number of Visits 17    Date for PT Re-Evaluation 08/08/20    PT Start Time 1111    PT Stop Time 1150    PT Time Calculation (min) 39 min    Activity Tolerance Patient tolerated treatment well    Behavior During Therapy [facility] for tasks assessed/performed           Past Medical History:  Diagnosis Date  . Asthma     Past Surgical History:  Procedure Laterality Date  . HERNIA REPAIR  2013  . KNEE SURGERY  1990    There were no vitals filed for this visit.   Subjective Assessment - 08/22/20 1112    Subjective Pt reports over the holidays he did not do any manual labor, just rested and stretched and today his pain is only 3/10.    Pertinent History Pt is a 69 year old male with R shoulder and scapular pain that has been on and off for many years, but it flared up again about 68months ago. Patient states he is back at the gym and working out regularly. 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 Patient reports the pain at worst can get up to 10/10 and the best it gets is a 2/10, and that is usually in the mornings. Pt denies N/V, B&B changes, saddle paresthesia, fever, night sweats, or unexplained weight loss.    Limitations House hold activities;Lifting    How long can you sit comfortably? unimited    How long can you stand comfortably? unlimited    How long can you walk comfortably? unlimited    Diagnostic tests Xray 06/04/19 unremarkable, arthritis in the joint    Patient Stated Goals Decrease pain    Pain Onset More than a month ago              Manual STM withtrigger point releaseto R mid trap fibers, rhomboids, and levator, with concordant sign Following:Dry Needling: (2/1/1)68mm .30needles placed along the R mid trap/rhomboids, levator, and UT (bilat) with pincerto decrease increased muscular spasms and trigger points with the patient positioned inprone. Patient was educated on risks and benefits of therapy and verbally consents to PT.   Ther-Ex -Seated thoracic ext over foam roll (hands behind head) x12 2-3sec hold with cuing for breath control with good carry over - Cobra stretch x12 with breath control, cuing for technique with noted decreased mobility in this position -Prone Alt supermans 2x 12 with better thoracic mobility noted -Good mornings with dowel overhead 2x 10 with cuing to keep dowel raised with hip hinge to decrease thoracic kyphosis with good understanding -Nustep seat 10 UE 10 L4 for protraction/retraction mobility28mins; some cuing throughout for cervical posture without excessive forward head - Theraball rollouts (modified childs post) 12x 3sechold w/ blue ball for increased thoracic ext for rollout, min cuing for breath control with good carry over UT stretch 32min Pec doorway stretch 51min        PT Education - 08/22/20 1120    Education Details TDN, therex form/technique    Person(s)  Educated Patient    Methods Explanation;Demonstration;Verbal cues    Comprehension Verbalized understanding;Returned demonstration;Verbal cues required            PT Short Term Goals - 07/11/20 1650      PT SHORT TERM GOAL #1   Title Pt will be independent with HEP in order to improve strength and decrease pain in order to improve pain-free function at home and work.    Baseline 07/11/20 HEP given    Time 4    Period Weeks    Status New    Target Date 08/08/20             PT Long Term Goals - 07/11/20 1651      PT LONG TERM GOAL #1   Title Patient will increase FOTO score to 74 to  demonstrate predicted increase in functional mobility to complete ADLs    Baseline 07/12/20 57    Time 8    Period Weeks    Status New      PT LONG TERM GOAL #2   Title Pt will decrease worst pain as reported on NPRS by at least 3 points in order to demonstrate clinically significant reduction in pain.    Baseline 07/11/20 10/10 at worst    Time 8    Period Weeks    Status New      PT LONG TERM GOAL #3   Title Pt will increase periscapular strength to 5/5 in order to demonstrate improvement in strength and be able to complete overhead activities without difficulty.    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    Baseline 07/11/20 rounded shoulders, forward head, thoracic kyphosis    Time 8    Period Weeks    Status New                 Plan - 08/22/20 1132    Clinical Impression Statement PT continued to utilize manual and TDN to relieve muscle tension to allow for pain free therex progression. Patient is able to comply with all cuing for proper technique of therex and posture with noted appropriate muscle fatigue. Pt with good motivation throughout session. PT will continue progression as able.    Personal Factors and Comorbidities Age;Comorbidity 1;Fitness;Profession    Comorbidities asthma    Examination-Activity Limitations Sit;Lift;Carry;Reach Overhead    Examination-Participation Restrictions Community Activity;Driving    Stability/Clinical Decision Making Evolving/Moderate complexity    Clinical Decision Making Moderate    Rehab Potential Good    PT Frequency 2x / week    PT Duration 8 weeks    PT Treatment/Interventions ADLs/Self Care Home Management;Iontophoresis 4mg /ml Dexamethasone;Electrical Stimulation;Functional mobility training;Neuromuscular  re-education;Taping;Spinal Manipulations;Joint Manipulations;Vestibular;Passive range of motion;Dry needling;Ultrasound;Traction;Cryotherapy;Moist Heat;Therapeutic exercise;Therapeutic activities;Patient/family education;Manual techniques    PT Next Visit Plan dry needling, postural exercises, stretching,    PT Home Exercise Plan cervical and scapular retraction, pec stretch, trap stretch    Consulted and Agree with Plan of Care Patient           Patient will benefit from skilled therapeutic intervention in order to improve the following deficits and impairments:  Decreased endurance,Decreased mobility,Hypomobility,Increased muscle spasms,Impaired sensation,Decreased activity tolerance,Decreased strength,Increased fascial restricitons,Impaired flexibility,Impaired UE functional use,Postural dysfunction,Pain,Improper body  mechanics,Impaired tone,Decreased range of motion  Visit Diagnosis: Chronic right shoulder pain  Stiffness of right shoulder, not elsewhere classified  Cervicalgia  Radiculopathy, cervicothoracic region     Problem List There are no problems to display for this patient.  Hilda Lias DPT Hilda Lias 08/22/2020, 11:56 AM  [facility] [address], [place], [address] Phone: [phone]   Fax:  [phone]  Name: CASHIS RILL MRN: [id number] Date of Birth: 1950/09/08

## 2020-08-24 ENCOUNTER — Ambulatory Visit: Payer: PPO | Admitting: Physical Therapy

## 2020-09-01 ENCOUNTER — Ambulatory Visit: Payer: PPO | Admitting: Physical Therapy

## 2020-09-09 ENCOUNTER — Ambulatory Visit: Payer: PPO | Attending: Family Medicine | Admitting: Physical Therapy

## 2020-09-09 ENCOUNTER — Encounter: Payer: Self-pay | Admitting: Physical Therapy

## 2020-09-09 ENCOUNTER — Other Ambulatory Visit: Payer: Self-pay

## 2020-09-09 DIAGNOSIS — M542 Cervicalgia: Secondary | ICD-10-CM | POA: Diagnosis not present

## 2020-09-09 DIAGNOSIS — M25611 Stiffness of right shoulder, not elsewhere classified: Secondary | ICD-10-CM | POA: Insufficient documentation

## 2020-09-09 DIAGNOSIS — G8929 Other chronic pain: Secondary | ICD-10-CM | POA: Insufficient documentation

## 2020-09-09 DIAGNOSIS — M25511 Pain in right shoulder: Secondary | ICD-10-CM | POA: Insufficient documentation

## 2020-09-09 DIAGNOSIS — M5413 Radiculopathy, cervicothoracic region: Secondary | ICD-10-CM | POA: Insufficient documentation

## 2020-09-09 NOTE — Therapy (Signed)
[facility] [address], [place], [address] Phone: [phone]   Fax:  [phone]  Physical Therapy Treatment  Patient Details  Name: Tyler Gregory MRN: [id number] Date of Birth: Mar 04, 1951 No data recorded  Encounter Date: 09/09/2020   PT End of Session - 09/09/20 0935    Visit Number 9    Number of Visits 17    Date for PT Re-Evaluation 10/07/20    PT Start Time 0920    PT Stop Time 0958    PT Time Calculation (min) 38 min    Activity Tolerance Patient tolerated treatment well    Behavior During Therapy [facility] for tasks assessed/performed           Past Medical History:  Diagnosis Date  . Asthma     Past Surgical History:  Procedure Laterality Date  . HERNIA REPAIR  2013  . KNEE SURGERY  1990    There were no vitals filed for this visit.   Subjective Assessment - 09/09/20 0921    Subjective Reports he has some increased pain at levator scapulae 3/10. Reports his HEP immediately helps, but when he starts lifting it bothers him. He reports he feels better after completing his HEP following lifting.    Pertinent History Pt is a 70 year old male with R shoulder and scapular pain that has been on and off for many years, but it flared up again about 32months ago. Patient states he is back at the gym and working out regularly. 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 Patient reports the pain at worst can get up to 10/10 and the best it gets is a 2/10, and that is usually in the mornings. Pt denies N/V, B&B changes, saddle paresthesia, fever, night sweats, or unexplained weight loss.    Limitations House hold activities;Lifting    How Tyler can you sit comfortably? unimited    How Tyler can you stand comfortably? unlimited    How Tyler can you walk comfortably? 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 Patient was educated on risks and benefits of therapy and verbally consents to PT.   Ther-Ex -Supine thoracic ext over rolled towel (hands behind head) x12 2-3sec hold with cuing for breath control with good carry over; with dowel and breath focus x12 with good carry over - Prone scap retractions (with hands behind head) with head/chest lift 3x 10  -Good mornings with dowel overhead 2x 10 with cuing to keep dowel raised with hip hinge to decrease thoracic kyphosis with good understanding -Nustep seat 10 UE 10 L4 for protraction/retraction mobility12mins; some cuing throughout for cervical posture without excessive forward head - Theraball rollouts (modified childs post) 12x 3sechold w/ blue ball for increased thoracic ext for rollout, min cuing for breath control with good carry over  Education on cervical traction with spine mobilizer with good understanding.  PT Short Term Goals - 07/11/20 1650      PT SHORT TERM GOAL #1   Title Pt will be independent with HEP in order to improve strength and decrease pain in order to improve pain-free function at home and work.    Baseline 07/11/20 HEP given    Time 4    Period Weeks    Status New    Target Date 08/08/20             PT Tyler Term Goals - 07/11/20 1651      PT Tyler TERM GOAL #1   Title Patient will increase FOTO score to 74 to demonstrate predicted increase in functional mobility to complete ADLs    Baseline 07/12/20 57    Time 8    Period Weeks    Status New       PT Tyler TERM GOAL #2   Title Pt will decrease worst pain as reported on NPRS by at least 3 points in order to demonstrate clinically significant reduction in pain.    Baseline 07/11/20 10/10 at worst    Time 8    Period Weeks    Status New      PT Tyler TERM GOAL #3   Title Pt will increase periscapular strength to 5/5 in order to demonstrate improvement in strength and be able to complete overhead activities without difficulty.    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    Baseline 07/11/20 rounded shoulders, forward head, thoracic kyphosis    Time 8    Period Weeks    Status New                 Plan - 09/09/20 1610    Clinical Impression Statement PT continued to utilize manual with TDN techniques to relieve muscle tension to allow for pain free therex progression with success. Patient is able to complete all therex with much better understanding of "neutral posutre" and good self corrections, following multimodal cuing. PT educated patient on use of his spine mobilizer with good understanding. PT will assess d/c to HEP readiness next session. PT will continue progression as able.    Personal Factors and Comorbidities Age;Comorbidity 1;Fitness;Profession    Comorbidities asthma    Examination-Activity Limitations Sit;Lift;Carry;Reach Overhead    Examination-Participation Restrictions Community Activity;Driving    Stability/Clinical Decision Making Evolving/Moderate complexity    Clinical Decision Making Moderate    Rehab Potential Good    PT Frequency 2x / week    PT Duration 8 weeks    PT Treatment/Interventions ADLs/Self Care Home Management;Iontophoresis 4mg /ml Dexamethasone;Electrical Stimulation;Functional mobility training;Neuromuscular  re-education;Taping;Spinal Manipulations;Joint Manipulations;Vestibular;Passive range of motion;Dry needling;Ultrasound;Traction;Cryotherapy;Moist Heat;Therapeutic exercise;Therapeutic activities;Patient/family education;Manual techniques    PT Next Visit Plan dry needling, postural exercises, stretching,    PT Home Exercise Plan cervical and scapular retraction, pec stretch, trap stretch    Consulted and Agree with Plan of Care Patient           Patient will benefit from skilled therapeutic intervention in order to improve the following deficits and impairments:  Decreased endurance,Decreased mobility,Hypomobility,Increased muscle spasms,Impaired sensation,Decreased activity tolerance,Decreased strength,Increased fascial restricitons,Impaired flexibility,Impaired UE functional use,Postural dysfunction,Pain,Improper body mechanics,Impaired tone,Decreased range of motion  Visit Diagnosis: Chronic  right shoulder pain  Stiffness of right shoulder, not elsewhere classified  Cervicalgia  Radiculopathy, cervicothoracic region     Problem List There are no problems to display for this patient.  Durwin Reges DPT Durwin Reges 09/09/2020, 10:01 AM  [facility] [address], [place], [address] Phone: [phone]   Fax:  [phone]  Name: Tyler Gregory MRN: [id number] Date of Birth: Dec 16, 1950

## 2020-09-13 ENCOUNTER — Ambulatory Visit: Payer: PPO | Admitting: Physical Therapy

## 2020-09-16 ENCOUNTER — Ambulatory Visit: Payer: PPO | Admitting: Physical Therapy

## 2020-09-28 ENCOUNTER — Ambulatory Visit: Payer: PPO | Attending: Family Medicine | Admitting: Physical Therapy

## 2020-10-03 ENCOUNTER — Telehealth: Payer: Self-pay

## 2020-10-03 NOTE — Telephone Encounter (Signed)
Copied from [facility] [place] [id number]. Topic: General - Other >> Oct 03, 2020  9:51 AM Leward Quan A wrote: Reason for CRM: Patient called in appointment set for 10/05/20 but asking if he can get an Rx for today was diagnosed with Covid took a test on 10/02/20 and again this morning both positive and have most of the Covid symptoms. Please advise Ph# [phone]

## 2020-10-03 NOTE — Telephone Encounter (Signed)
Please Review

## 2020-10-03 NOTE — Telephone Encounter (Signed)
He would need an appt. I have some available tomorrow if he can do a virtual.   I don't know his symptoms or what he would need. If he is wanting an antibiotic, covid is viral, so antibiotics do not help.

## 2020-10-03 NOTE — Telephone Encounter (Signed)
Patient was advised and appointment was scheduled with Tawanna Sat for tomorrow,10/04/2020.

## 2020-10-04 ENCOUNTER — Telehealth (INDEPENDENT_AMBULATORY_CARE_PROVIDER_SITE_OTHER): Payer: PPO | Admitting: Physician Assistant

## 2020-10-04 ENCOUNTER — Encounter: Payer: Self-pay | Admitting: Physician Assistant

## 2020-10-04 DIAGNOSIS — U071 COVID-19: Secondary | ICD-10-CM | POA: Diagnosis not present

## 2020-10-04 NOTE — Progress Notes (Signed)
Virtual telephone visit    Virtual Visit via Telephone Note   This visit type was conducted due to national recommendations for restrictions regarding the COVID-19 Pandemic (e.g. social distancing) in an effort to limit this patient's exposure and mitigate transmission in our community. Due to his co-morbid illnesses, this patient is at least at moderate risk for complications without adequate follow up. This format is felt to be most appropriate for this patient at this time. The patient did not have access to video technology or had technical difficulties with video requiring transitioning to audio format only (telephone). Physical exam was limited to content and character of the telephone converstion.    Patient location: Home Provider location: [facility]  I discussed the limitations of evaluation and management by telemedicine and the availability of in person appointments. The patient expressed understanding and agreed to proceed.   Visit Date: 10/04/2020  Today's healthcare provider: Mar Daring, PA-C   Chief Complaint  Patient presents with  . Covid Positive   Subjective    HPI  Test positive for Covid 10/03/2020 (Home Test). He reports overall he is feeling better. Started feeling bad last Thursday-Friday. Had scratchy throat, nasal congestion, dry cough, constipation, fatigue. Had some night sweats in the beginning. Symptoms overall now are improving. Still has slight dry cough and fatigue. No SOB, difficulty breathing, chest pain.Taking vicks sever cold and flu, flonase, zinc, hot tea.  Previously had Covid 19 in January 2021. Not vaccinated.   There are no problems to display for this patient.  Past Medical History:  Diagnosis Date  . Asthma    Social History   Tobacco Use  . Smoking status: Former Smoker    Types: Cigarettes  . Smokeless tobacco: Never Used  . Tobacco comment: on and off; quit around 2000  Vaping Use  . Vaping Use:  Never used  Substance Use Topics  . Alcohol use: Yes    Alcohol/week: 12.0 standard drinks    Types: 12 Cans of beer per week    Comment: 0-2 beers a day  . Drug use: Not Currently   Allergies  Allergen Reactions  . Shrimp [Shellfish Allergy] Swelling    Medications: Outpatient Medications Prior to Visit  Medication Sig  . acetaminophen (TYLENOL) 325 MG tablet Take 650 mg by mouth every 6 (six) hours as needed.  Marland Kitchen albuterol (VENTOLIN HFA) 108 (90 Base) MCG/ACT inhaler Inhale 2 puffs into the lungs every 6 (six) hours as needed for wheezing or shortness of breath.  . fluticasone (FLONASE) 50 MCG/ACT nasal spray Place 1 spray into both nostrils daily. As needed   No facility-administered medications prior to visit.    Review of Systems  Constitutional: Positive for fatigue. Negative for appetite change, chills, diaphoresis, fever and unexpected weight change.  HENT: Positive for congestion, postnasal drip, sinus pressure, sinus pain and sore throat.   Respiratory: Positive for cough. Negative for apnea, choking, chest tightness, shortness of breath, wheezing and stridor.   Cardiovascular: Negative for chest pain, palpitations and leg swelling.  Gastrointestinal: Positive for constipation. Negative for abdominal distention, abdominal pain, anal bleeding, blood in stool, diarrhea, nausea, rectal pain and vomiting.  Musculoskeletal: Positive for myalgias. Negative for arthralgias, back pain, gait problem, joint swelling, neck pain and neck stiffness.  Neurological: Positive for headaches. Negative for dizziness and light-headedness.      Objective    There were no vitals taken for this visit.     Assessment & Plan  1. COVID-19 Tested positive on 10/03/20. Symptoms started Friday. Reports feeling some better today. Continue symptomatic relief. Advised of return precautions and when to proceed to the ER. He voiced understanding. All questions were answered.   No follow-ups on  file.    I discussed the assessment and treatment plan with the patient. The patient was provided an opportunity to ask questions and all were answered. The patient agreed with the plan and demonstrated an understanding of the instructions.   The patient was advised to call back or seek an in-person evaluation if the symptoms worsen or if the condition fails to improve as anticipated.  I provided 11 minutes of non-face-to-face time during this encounter.  Reynolds Bowl, PA-C, have reviewed all documentation for this visit. The documentation on 10/04/20 for the exam, diagnosis, procedures, and orders are all accurate and complete.  [place] [facility] [phone] (phone) [phone] (fax)  [place]

## 2020-10-05 ENCOUNTER — Ambulatory Visit: Payer: PPO | Admitting: Physical Therapy

## 2020-10-05 ENCOUNTER — Telehealth: Payer: PPO | Admitting: Adult Health

## 2020-10-11 ENCOUNTER — Ambulatory Visit: Payer: PPO | Admitting: Physical Therapy

## 2020-10-13 ENCOUNTER — Encounter: Payer: PPO | Admitting: Physical Therapy

## 2020-10-19 ENCOUNTER — Encounter: Payer: PPO | Admitting: Physical Therapy

## 2020-10-21 ENCOUNTER — Encounter: Payer: PPO | Admitting: Physical Therapy

## 2020-10-24 ENCOUNTER — Ambulatory Visit: Payer: PPO | Admitting: Physical Therapy

## 2020-10-24 ENCOUNTER — Telehealth: Payer: Self-pay | Admitting: Family Medicine

## 2020-10-24 NOTE — Telephone Encounter (Signed)
Pt was seen on 10/04/20 with a positve covid dx. Pt reports that he is not feeling better. And that he is having chest congestion. Pt would like to know is another appt needed? Please advise CB- [phone] Preferred Pharmacy- [facility] [address] [place], [place].

## 2020-10-25 NOTE — Telephone Encounter (Signed)
Is it okay for him to come into the office?  Thanks,   -Mickel Baas

## 2020-10-25 NOTE — Telephone Encounter (Signed)
Pt scheduled with Sharyn Lull 10/26/2020 at 2:40  Thanks,   -Mickel Baas

## 2020-10-25 NOTE — Telephone Encounter (Signed)
He could come in. Has been over 2 weeks.

## 2020-10-26 ENCOUNTER — Ambulatory Visit: Payer: PPO | Admitting: Adult Health

## 2020-10-27 ENCOUNTER — Ambulatory Visit: Payer: PPO | Admitting: Adult Health

## 2020-10-27 DIAGNOSIS — Z91199 Patient's noncompliance with other medical treatment and regimen due to unspecified reason: Secondary | ICD-10-CM | POA: Insufficient documentation

## 2020-10-27 DIAGNOSIS — Z5329 Procedure and treatment not carried out because of patient's decision for other reasons: Secondary | ICD-10-CM | POA: Insufficient documentation

## 2020-10-27 NOTE — Progress Notes (Unsigned)
      Established patient visit   No show for scheduled appointment.

## 2020-10-28 ENCOUNTER — Ambulatory Visit (INDEPENDENT_AMBULATORY_CARE_PROVIDER_SITE_OTHER): Payer: PPO | Admitting: Adult Health

## 2020-10-28 ENCOUNTER — Encounter: Payer: Self-pay | Admitting: Adult Health

## 2020-10-28 ENCOUNTER — Other Ambulatory Visit: Payer: Self-pay

## 2020-10-28 VITALS — BP 141/73 | HR 74 | Temp 98.2°F | Resp 15 | Wt 223.2 lb

## 2020-10-28 DIAGNOSIS — J0140 Acute pansinusitis, unspecified: Secondary | ICD-10-CM | POA: Diagnosis not present

## 2020-10-28 DIAGNOSIS — R059 Cough, unspecified: Secondary | ICD-10-CM | POA: Insufficient documentation

## 2020-10-28 DIAGNOSIS — J40 Bronchitis, not specified as acute or chronic: Secondary | ICD-10-CM | POA: Insufficient documentation

## 2020-10-28 DIAGNOSIS — Z8616 Personal history of COVID-19: Secondary | ICD-10-CM | POA: Insufficient documentation

## 2020-10-28 MED ORDER — PREDNISONE 10 MG (21) PO TBPK: ORAL_TABLET | ORAL | 0 refills | Status: DC

## 2020-10-28 MED ORDER — AMOXICILLIN-POT CLAVULANATE 875-125 MG PO TABS: 1.0000 | ORAL_TABLET | Freq: Two times a day (BID) | ORAL | 0 refills | Status: DC

## 2020-10-28 NOTE — Progress Notes (Signed)
Established patient visit   Patient: Tyler Gregory   DOB: February 27, 1951   70 y.o. Male  MRN: [id number] Visit Date: 10/28/2020  Today's healthcare provider: Marcille Buffy, FNP   Chief Complaint  Patient presents with  . URI   Subjective    HPI HPI    URI    Associated symptoms inlclude congestion (Clearing of throat).  Recent episode started 1 to 4 weeks ago.  The temperature has been with in normal range.  Patient  is drinking plenty of fluids.       Last edited by Minette Headland, CMA on 10/28/2020  3:28 PM. (History)      Patient is a 70 year old who was diagnosed on February 8th with covid, he was sick 4 days prior to that.  He continues to have a residual cough.  Denies any breathing issues or wheezing. Mild chest tightness.  Productive cough not clear but not green.  Patient  denies any fever, body aches,chills, rash, chest pain, shortness of breath, nausea, vomiting, or diarrhea.  Denies dizziness, lightheadedness, pre syncopal or syncopal episodes.     Patient Active Problem List   Diagnosis Date Noted  . Acute non-recurrent pansinusitis 10/28/2020  . Cough 10/28/2020  . History of COVID-19 10/28/2020  . Bronchitis 10/28/2020  . No-show for appointment 10/27/2020   Past Medical History:  Diagnosis Date  . Asthma        Medications: Outpatient Medications Prior to Visit  Medication Sig  . acetaminophen (TYLENOL) 325 MG tablet Take 650 mg by mouth every 6 (six) hours as needed.  Marland Kitchen albuterol (VENTOLIN HFA) 108 (90 Base) MCG/ACT inhaler Inhale 2 puffs into the lungs every 6 (six) hours as needed for wheezing or shortness of breath.  . fluticasone (FLONASE) 50 MCG/ACT nasal spray Place 1 spray into both nostrils daily. As needed   No facility-administered medications prior to visit.    Review of Systems  Constitutional: Positive for fatigue. Negative for activity change, appetite change, chills, diaphoresis, fever and unexpected weight change.   HENT: Positive for congestion, postnasal drip, rhinorrhea and sore throat.   Respiratory: Positive for cough and chest tightness. Negative for apnea, choking, shortness of breath, wheezing and stridor.   Cardiovascular: Negative.  Negative for palpitations and leg swelling.  Gastrointestinal: Negative.   Genitourinary: Negative.   Musculoskeletal: Negative.   Skin: Negative.   Neurological: Negative.   Hematological: Negative.   Psychiatric/Behavioral: Negative.     Last CBC Lab Results  Component Value Date   WBC 6.3 12/17/2019   HGB 15.9 12/17/2019   HCT 44.8 12/17/2019   MCV 88 12/17/2019   MCH 31.2 12/17/2019   RDW 12.7 12/17/2019   PLT 200 77/82/4235   Last metabolic panel Lab Results  Component Value Date   GLUCOSE 133 (H) 12/17/2019   NA 140 12/17/2019   K 4.0 12/17/2019   CL 103 12/17/2019   CO2 23 12/17/2019   BUN 15 12/17/2019   CREATININE 0.89 12/17/2019   GFRNONAA 88 12/17/2019   GFRAA 102 12/17/2019   CALCIUM 9.5 12/17/2019   PROT 7.1 12/17/2019   ALBUMIN 4.6 12/17/2019   LABGLOB 2.5 12/17/2019   AGRATIO 1.8 12/17/2019   BILITOT 0.5 12/17/2019   ALKPHOS 59 12/17/2019   AST 22 12/17/2019   ALT 30 12/17/2019       Objective    BP (!) 141/73   Pulse 74   Temp 98.2 F (36.8 C) (Oral)  Resp 15   Wt 223 lb 3.2 oz (101.2 kg)   SpO2 98%   BMI 30.27 kg/m     Physical Exam Constitutional:      General: He is not in acute distress.    Appearance: Normal appearance. He is not ill-appearing, toxic-appearing or diaphoretic.  HENT:     Right Ear: External ear normal.     Left Ear: External ear normal.     Nose: Congestion and rhinorrhea present.     Mouth/Throat:     Mouth: Mucous membranes are moist.     Pharynx: Posterior oropharyngeal erythema present.  Eyes:     General: No scleral icterus.       Right eye: No discharge.        Left eye: No discharge.     Conjunctiva/sclera: Conjunctivae normal.     Pupils: Pupils are equal, round, and  reactive to light.  Cardiovascular:     Rate and Rhythm: Normal rate and regular rhythm.     Pulses: Normal pulses.     Heart sounds: Normal heart sounds.  Pulmonary:     Effort: No respiratory distress.     Breath sounds: No stridor. Wheezing present. No rhonchi or rales.  Chest:     Chest wall: No tenderness.  Abdominal:     Palpations: Abdomen is soft.  Musculoskeletal:        General: Normal range of motion.     Cervical back: Normal range of motion and neck supple.  Lymphadenopathy:     Cervical: Cervical adenopathy present.     Right cervical: Superficial cervical adenopathy (mild bilateral shotty nodes, ) present. No deep or posterior cervical adenopathy.    Left cervical: Superficial cervical adenopathy present. No deep or posterior cervical adenopathy.  Skin:    General: Skin is warm.     Findings: No rash.  Neurological:     General: No focal deficit present.     Mental Status: He is alert and oriented to person, place, and time.     Motor: No weakness.     Gait: Gait normal.  Psychiatric:        Mood and Affect: Mood normal.        Behavior: Behavior normal.        Thought Content: Thought content normal.        Judgment: Judgment normal.      No results found for any visits on 10/28/20.  Assessment & Plan     Acute non-recurrent pansinusitis  Cough - Plan: CBC with Differential/Platelet, Comprehensive metabolic panel, DG Chest 2 View, amoxicillin-clavulanate (AUGMENTIN) 875-125 MG tablet, predniSONE (STERAPRED UNI-PAK 21 TAB) 10 MG (21) TBPK tablet  History of COVID-19  Bronchitis  Meds ordered this encounter  Medications  . amoxicillin-clavulanate (AUGMENTIN) 875-125 MG tablet    Sig: Take 1 tablet by mouth 2 (two) times daily.    Dispense:  20 tablet    Refill:  0  . predniSONE (STERAPRED UNI-PAK 21 TAB) 10 MG (21) TBPK tablet    Sig: PO: Take 6 tablets on day 1:Take 5 tablets day 2:Take 4 tablets day 3: Take 3 tablets day 4:Take 2 tablets day five:  5 Take 1 tablet day 6    Dispense:  21 tablet    Refill:  0   Orders Placed This Encounter  Procedures  . DG Chest 2 View  . CBC with Differential/Platelet  . Comprehensive metabolic panel   Red Flags discussed. The patient was given clear instructions  to go to ER or return to medical center if any red flags develop, symptoms do not improve, worsen or new problems develop. They verbalized understanding.  Return in about 1 week (around 11/04/2020), or if symptoms worsen or fail to improve, for at any time for any worsening symptoms, Go to Emergency room/ urgent care if worse.    The entirety of the information documented in the History of Present Illness, Review of Systems and Physical Exam were personally obtained by me. Portions of this information were initially documented by the CMA and reviewed by me for thoroughness and accuracy.      Marcille Buffy, [place] [phone] (phone) [phone] (fax)  [place]

## 2020-10-28 NOTE — Patient Instructions (Signed)
Acute Bronchitis, Adult  Acute bronchitis is sudden or acute swelling of the air tubes (bronchi) in the lungs. Acute bronchitis causes these tubes to fill with mucus, which can make it hard to breathe. It can also cause coughing or wheezing. In adults, acute bronchitis usually goes away within 2 weeks. A cough caused by bronchitis may last up to 3 weeks. Smoking, allergies, and asthma can make the condition worse. What are the causes? This condition can be caused by germs and by substances that irritate the lungs, including:  Cold and flu viruses. The most common cause of this condition is the virus that causes the common cold.  Bacteria.  Substances that irritate the lungs, including: ? Smoke from cigarettes and other forms of tobacco. ? Dust and pollen. ? Fumes from chemical products, gases, or burned fuel. ? Other materials that pollute indoor or outdoor air.  Close contact with someone who has acute bronchitis. What increases the risk? The following factors may make you more likely to develop this condition:  A weak body's defense system, also called the immune system.  A condition that affects your lungs and breathing, such as asthma. What are the signs or symptoms? Common symptoms of this condition include:  Lung and breathing problems, such as: ? Coughing. This may bring up clear, yellow, or green mucus from your lungs (sputum). ? Wheezing. ? Having too much mucus in your lungs (chest congestion). ? Having shortness of breath.  A fever.  Chills.  Aches and pains, including: ? Tightness in your chest and other body aches. ? A sore throat. How is this diagnosed? This condition is usually diagnosed based on:  Your symptoms and medical history.  A physical exam. You may also have other tests, including tests to rule out other conditions, such pneumonia. These tests include:  A test of lung function.  Test of a mucus sample to look for the presence of  bacteria.  Tests to check the oxygen level in your blood.  Blood tests.  Chest X-ray. How is this treated? Most cases of acute bronchitis clear up over time without treatment. Your health care provider may recommend:  Drinking more fluids. This can thin your mucus, which may improve your breathing.  Taking a medicine for a fever or cough.  Using a device that gets medicine into your lungs (inhaler) to help improve breathing and control coughing.  Using a vaporizer or a humidifier. These are machines that add water to the air to help you breathe better. Follow these instructions at home: Activity  Get plenty of rest.  Return to your normal activities as told by your health care provider. Ask your health care provider what activities are safe for you. Lifestyle  Drink enough fluid to keep your urine pale yellow.  Do not drink alcohol.  Do not use any products that contain nicotine or tobacco, such as cigarettes, e-cigarettes, and chewing tobacco. If you need help quitting, ask your health care provider. Be aware that: ? Your bronchitis will get worse if you smoke or breathe in other people's smoke (secondhand smoke). ? Your lungs will heal faster if you quit smoking. General instructions  Take over-the-counter and prescription medicines only as told by your health care provider.  Use an inhaler, vaporizer, or humidifier as told by your health care provider.  If you have a sore throat, gargle with a salt-water mixture 3-4 times a day or as needed. To make a salt-water mixture, completely dissolve -1 tsp (3-6 g)   of salt in 1 cup (237 mL) of warm water.  Keep all follow-up visits as told by your health care provider. This is important.   How is this prevented? To lower your risk of getting this condition again:  Wash your hands often with soap and water. If soap and water are not available, use hand sanitizer.  Avoid contact with people who have cold symptoms.  Try not to  touch your mouth, nose, or eyes with your hands.  Avoid places where there are fumes from chemicals. Breathing these fumes will make your condition worse.  Get the flu shot every year.   Contact a health care provider if:  Your symptoms do not improve after 2 weeks of treatment.  You vomit more than once or twice.  You have symptoms of dehydration such as: ? Dark urine. ? Dry skin or eyes. ? Increased thirst. ? Headaches. ? Confusion. ? Muscle cramps. Get help right away if you:  Cough up blood.  Feel pain in your chest.  Have severe shortness of breath.  Faint or keep feeling like you are going to faint.  Have a severe headache.  Have fever or chills that get worse. These symptoms may represent a serious problem that is an emergency. Do not wait to see if the symptoms will go away. Get medical help right away. Call your local emergency services (911 in the [place] Do not drive yourself to the hospital. Summary  Acute bronchitis is sudden (acute) inflammation of the air tubes (bronchi) between the windpipe and the lungs. In adults, acute bronchitis usually goes away within 2 weeks, although coughing may last 3 weeks or longer  Take over-the-counter and prescription medicines only as told by your health care provider.  Drink enough fluid to keep your urine pale yellow.  Contact a health care provider if your symptoms do not improve after 2 weeks of treatment.  Get help right away if you cough up blood, faint, or have chest pain or shortness of breath. This information is not intended to replace advice given to you by your health care provider. Make sure you discuss any questions you have with your health care provider. Document Revised: 04/27/2019 Document Reviewed: 03/06/2019 Elsevier Patient Education  2021 [employer]. Prednisolone tablets What is this medicine? PREDNISOLONE (pred NISS oh lone) is a corticosteroid. It is commonly used to treat inflammation of the  skin, joints, lungs, and other organs. Common conditions treated include asthma, allergies, and arthritis. It is also used for other conditions, such as blood disorders and diseases of the adrenal glands. This medicine may be used for other purposes; ask your health care provider or pharmacist if you have questions. COMMON BRAND NAME(S): Millipred, Millipred DP, Millipred DP 12-Day, Millipred DP 6 Day, Prednoral What should I tell my health care provider before I take this medicine? They need to know if you have any of these conditions:  Cushing's syndrome  diabetes  glaucoma  heart problems or disease  high blood pressure  infection such as herpes, measles, tuberculosis, or chickenpox  kidney disease  liver disease  mental problems  myasthenia gravis  osteoporosis  seizures  stomach ulcer or intestine disease including colitis and diverticulitis  thyroid problem  an unusual or allergic reaction to lactose, prednisolone, other medicines, foods, dyes, or preservatives  pregnant or trying to get pregnant  breast-feeding How should I use this medicine? Take this medicine by mouth with a glass of water. Follow the directions on the prescription label.  Take it with food or milk to avoid stomach upset. If you are taking this medicine once a day, take it in the morning. Do not take more medicine than you are told to take. Do not suddenly stop taking your medicine because you may develop a severe reaction. Your doctor will tell you how much medicine to take. If your doctor wants you to stop the medicine, the dose may be slowly lowered over time to avoid any side effects. Talk to your pediatrician regarding the use of this medicine in children. Special care may be needed. Overdosage: If you think you have taken too much of this medicine contact a poison control center or emergency room at once. NOTE: This medicine is only for you. Do not share this medicine with others. What if I  miss a dose? If you miss a dose, take it as soon as you can. If it is almost time for your next dose, take only that dose. Do not take double or extra doses. What may interact with this medicine? Do not take this medicine with any of the following medications:  metyrapone  mifepristone This medicine may also interact with the following medications:  aminoglutethimide  amphotericin B  aspirin and aspirin-like medicines  barbiturates  certain medicines for diabetes, like glipizide or glyburide  cholestyramine  cholinesterase inhibitors  cyclosporine  digoxin  diuretics  ephedrine  male hormones, like estrogens and birth control pills  isoniazid  ketoconazole  NSAIDS, medicines for pain and inflammation, like ibuprofen or naproxen  phenytoin  rifampin  toxoids  vaccines  warfarin This list may not describe all possible interactions. Give your health care provider a list of all the medicines, herbs, non-prescription drugs, or dietary supplements you use. Also tell them if you smoke, drink alcohol, or use illegal drugs. Some items may interact with your medicine. What should I watch for while using this medicine? Visit your doctor or health care professional for regular checks on your progress. If you are taking this medicine over a prolonged period, carry an identification card with your name and address, the type and dose of your medicine, and your doctor's name and address. This medicine may increase your risk of getting an infection. Tell your doctor or health care professional if you are around anyone with measles or chickenpox, or if you develop sores or blisters that do not heal properly. If you are going to have surgery, tell your doctor or health care professional that you have taken this medicine within the last twelve months. Ask your doctor or health care professional about your diet. You may need to lower the amount of salt you eat. This medicine may  increase blood sugar. Ask your healthcare provider if changes in diet or medicines are needed if you have diabetes. What side effects may I notice from receiving this medicine? Side effects that you should report to your doctor or health care professional as soon as possible:  allergic reactions like skin rash, itching or hives, swelling of the face, lips, or tongue  changes in emotions or moods  eye pain   signs and symptoms of high blood sugar such as being more thirsty or hungry or having to urinate more than normal. You may also feel very tired or have blurry vision.  signs and symptoms of infection like fever or chills; cough; sore throat; pain or trouble passing urine  slow growth in children (if used for longer periods of time)  swelling of ankles, feet  trouble  sleeping  weak bones (if used for longer periods of time) Side effects that usually do not require medical attention (report to your doctor or health care professional if they continue or are bothersome):  nausea  skin problems, acne, thin and shiny skin  upset stomach  weight gain This list may not describe all possible side effects. Call your doctor for medical advice about side effects. You may report side effects to FDA at 1-800-FDA-1088. Where should I keep my medicine? Keep out of the reach of children. Store at room temperature between 15 and 30 degrees C (59 and 86 degrees F). Keep container tightly closed. Throw away any unused medicine after the expiration date. NOTE: This sheet is a summary. It may not cover all possible information. If you have questions about this medicine, talk to your doctor, pharmacist, or health care provider.  2021 Elsevier/Gold Standard (2018-05-15 10:30:56) Amoxicillin; Clavulanic Acid Tablets What is this medicine? AMOXICILLIN; CLAVULANIC ACID (a mox i SIL in; KLAV yoo lan ic AS id) is a penicillin antibiotic. It treats some infections caused by bacteria. It will not work  for colds, the flu, or other viruses. This medicine may be used for other purposes; ask your health care provider or pharmacist if you have questions. COMMON BRAND NAME(S): Augmentin What should I tell my health care provider before I take this medicine? They need to know if you have any of these conditions:  kidney disease  liver disease  mononucleosis  stomach or intestine problems such as colitis  an unusual or allergic reaction to amoxicillin, other penicillin or cephalosporin antibiotics, clavulanic acid, other medicines, foods, dyes, or preservatives  pregnant or trying to get pregnant  breast-feeding How should I use this medicine? Take this drug by mouth. Take it as directed on the prescription label at the same time every day. Take it with food at the start of a meal or snack. Take all of this drug unless your health care provider tells you to stop it early. Keep taking it even if you think you are better. Talk to your health care provider about the use of this drug in children. While it may be prescribed for selected conditions, precautions do apply. Overdosage: If you think you have taken too much of this medicine contact a poison control center or emergency room at once. NOTE: This medicine is only for you. Do not share this medicine with others. What if I miss a dose? If you miss a dose, take it as soon as you can. If it is almost time for your next dose, take only that dose. Do not take double or extra doses. What may interact with this medicine?  allopurinol  anticoagulants  birth control pills  methotrexate  probenecid This list may not describe all possible interactions. Give your health care provider a list of all the medicines, herbs, non-prescription drugs, or dietary supplements you use. Also tell them if you smoke, drink alcohol, or use illegal drugs. Some items may interact with your medicine. What should I watch for while using this medicine? Tell your  health care provider if your symptoms do not start to get better or if they get worse. This medicine may cause serious skin reactions. They can happen weeks to months after starting the medicine. Contact your health care provider right away if you notice fevers or flu-like symptoms with a rash. The rash may be red or purple and then turn into blisters or peeling of the skin. Or, you might  notice a red rash with swelling of the face, lips or lymph nodes in your neck or under your arms. Do not treat diarrhea with over the counter products. Contact your health care provider if you have diarrhea that lasts more than 2 days or if it is severe and watery. If you have diabetes, you may get a false-positive result for sugar in your urine. Check with your health care provider. Birth control may not work properly while you are taking this medicine. Talk to your health care provider about using an extra method of birth control. What side effects may I notice from receiving this medicine? Side effects that you should report to your doctor or health care provider as soon as possible:  allergic reactions (skin rash, itching or hives; swelling of the face, lips, or tongue)  bloody or watery diarrhea  dark urine  infection (fever, chills, cough, sore throat, or pain)  kidney injury (trouble passing urine or change in the amount of urine)  redness, blistering, peeling, or loosening of the skin, including inside the mouth  seizures  thrush (white patches in the mouth or mouth sores)  trouble breathing  unusual bruising or bleeding  unusually weak or tired Side effects that usually do not require medical attention (report to your doctor or health care provider if they continue or are bothersome):  diarrhea  dizziness  headache  nausea, vomiting  unusual vaginal discharge, itching, or odor  upset stomach This list may not describe all possible side effects. Call your doctor for medical advice  about side effects. You may report side effects to FDA at 1-800-FDA-1088. Where should I keep my medicine? Keep out of the reach of children and pets. Store at room temperature between 20 and 25 degrees C (68 and 77 degrees F). Throw away any unused drug after the expiration date. NOTE: This sheet is a summary. It may not cover all possible information. If you have questions about this medicine, talk to your doctor, pharmacist, or health care provider.  2021 Elsevier/Gold Standard (2020-07-06 09:45:03)

## 2020-10-29 LAB — COMPREHENSIVE METABOLIC PANEL
ALT: 20 IU/L (ref 0–44)
AST: 14 IU/L (ref 0–40)
Albumin/Globulin Ratio: 1.6 (ref 1.2–2.2)
Albumin: 4.2 g/dL (ref 3.8–4.8)
Alkaline Phosphatase: 59 IU/L (ref 44–121)
BUN/Creatinine Ratio: 18 (ref 10–24)
BUN: 16 mg/dL (ref 8–27)
Bilirubin Total: 0.4 mg/dL (ref 0.0–1.2)
CO2: 23 mmol/L (ref 20–29)
Calcium: 9.1 mg/dL (ref 8.6–10.2)
Chloride: 105 mmol/L (ref 96–106)
Creatinine, Ser: 0.88 mg/dL (ref 0.76–1.27)
Globulin, Total: 2.7 g/dL (ref 1.5–4.5)
Glucose: 105 mg/dL — ABNORMAL HIGH (ref 65–99)
Potassium: 4.2 mmol/L (ref 3.5–5.2)
Sodium: 142 mmol/L (ref 134–144)
Total Protein: 6.9 g/dL (ref 6.0–8.5)
eGFR: 94 mL/min/{1.73_m2} (ref >59)

## 2020-10-29 LAB — CBC WITH DIFFERENTIAL/PLATELET
Basophils Absolute: 0 10*3/uL (ref 0.0–0.2)
Basos: 1 %
EOS (ABSOLUTE): 0.2 10*3/uL (ref 0.0–0.4)
Eos: 3 %
Hematocrit: 41.1 % (ref 37.5–51.0)
Hemoglobin: 14.2 g/dL (ref 13.0–17.7)
Immature Grans (Abs): 0 10*3/uL (ref 0.0–0.1)
Immature Granulocytes: 0 %
Lymphocytes Absolute: 1.5 10*3/uL (ref 0.7–3.1)
Lymphs: 22 %
MCH: 30.6 pg (ref 26.6–33.0)
MCHC: 34.5 g/dL (ref 31.5–35.7)
MCV: 89 fL (ref 79–97)
Monocytes Absolute: 0.6 10*3/uL (ref 0.1–0.9)
Monocytes: 9 %
Neutrophils Absolute: 4.5 10*3/uL (ref 1.4–7.0)
Neutrophils: 65 %
Platelets: 180 10*3/uL (ref 150–450)
RBC: 4.64 x10E6/uL (ref 4.14–5.80)
RDW: 13.1 % (ref 11.6–15.4)
WBC: 6.9 10*3/uL (ref 3.4–10.8)

## 2020-10-29 NOTE — Progress Notes (Signed)
Labs within normal, glucose was mildly elevated 105 this was acute visit and he was not fasting.

## 2021-06-16 ENCOUNTER — Telehealth: Payer: Self-pay | Admitting: Family Medicine

## 2021-06-16 DIAGNOSIS — Z1211 Encounter for screening for malignant neoplasm of colon: Secondary | ICD-10-CM

## 2021-06-16 NOTE — Telephone Encounter (Signed)
Copied from [place] [phone]. Topic: Appointment Scheduling - Scheduling Inquiry for Clinic >> Jun 16, 2021  9:38 AM Scherrie Gerlach wrote: Reason for CRM: pt would like appt so that he can get a referral for a colonoscopy. Pt used to see Simona Huh. He said please call at your convenience.

## 2021-06-22 ENCOUNTER — Encounter: Payer: Self-pay | Admitting: Gastroenterology

## 2021-06-22 ENCOUNTER — Ambulatory Visit: Payer: PPO | Admitting: Gastroenterology

## 2021-06-22 ENCOUNTER — Other Ambulatory Visit: Payer: Self-pay

## 2021-06-22 VITALS — BP 136/91 | HR 78 | Temp 98.5°F | Ht 72.0 in | Wt 226.0 lb

## 2021-06-22 DIAGNOSIS — R109 Unspecified abdominal pain: Secondary | ICD-10-CM | POA: Diagnosis not present

## 2021-06-22 DIAGNOSIS — Z1211 Encounter for screening for malignant neoplasm of colon: Secondary | ICD-10-CM

## 2021-06-22 MED ORDER — NA SULFATE-K SULFATE-MG SULF 17.5-3.13-1.6 GM/177ML PO SOLN: 1.0000 | Freq: Once | ORAL | 0 refills | Status: AC

## 2021-06-22 NOTE — Progress Notes (Signed)
Vonda Antigua [address]  [place], [place] [address]  Main: [phone]  Fax: [phone]   Gastroenterology Consultation  Referring Provider:     Vernie Murders Primary Care Physician:  Margo Common, PA-C (Inactive) Reason for Consultation:     Abdominal pain        HPI:    Chief Complaint  Patient presents with   New Patient (Initial Visit)   Abdominal Pain    Pt reports lower abd pain lasting 2-3 days, Pt reports normal QD BM, denies N/V or blood in stools... not sure if work and climbing ladder contributed     KENDLE ERKER is a 70 y.o. y/o male referred for consultation & management  by Dr. Natale Milch, Vickki Muff, PA-C (Inactive).  Patient was initially referred for screening colonoscopy.  During scheduling, he mentioned having abdominal pain and therefore this appointment was made for evaluation.  At this time the abdominal pain has resolved.  Patient described having an episode of epigastric to mid abdominal pain, that may have occurred after lifting something.  This has completely resolved.  It was associated with groin pain at that time to and this has completely resolved as well.  No nausea or vomiting.  No altered bowel habits.  Milligrams daily.  No family history of colon cancer.  Has never had a screening colonoscopy.  Describes yesterday after eating some waffles, he had 1 loose stool but this has not reoccurred since then.  Denies any frequent heartburn.  No dysphagia.  Past Medical History:  Diagnosis Date   Asthma     Past Surgical History:  Procedure Laterality Date   HERNIA REPAIR  2013   [facility]    Prior to Admission medications   Medication Sig Start Date End Date Taking? Authorizing Provider  albuterol (VENTOLIN HFA) 108 (90 Base) MCG/ACT inhaler Inhale 2 puffs into the lungs every 6 (six) hours as needed for wheezing or shortness of breath. 05/30/20  Yes Chrismon, Vickki Muff, PA-C  fluticasone (FLONASE) 50 MCG/ACT  nasal spray Place 1 spray into both nostrils daily. As needed   Yes [provider]  Na Sulfate-K Sulfate-Mg Sulf 17.5-3.13-1.6 GM/177ML SOLN Take 1 kit by mouth once for 1 dose. 06/22/21 06/22/21 Yes Virgel Manifold, MD    Family History  Problem Relation Age of Onset   Breast cancer Mother    Emphysema Father      Social History   Tobacco Use   Smoking status: Former    Types: Cigarettes   Smokeless tobacco: Never   Tobacco comments:    on and off; quit around 2000  Vaping Use   Vaping Use: Never used  Substance Use Topics   Alcohol use: Yes    Alcohol/week: 12.0 standard drinks    Types: 12 Cans of beer per week    Comment: 0-2 beers a day   Drug use: Not Currently    Allergies as of 06/22/2021 - Review Complete 06/22/2021  Allergen Reaction Noted   Shrimp [shellfish allergy] Swelling 12/09/2017    Review of Systems:    All systems reviewed and negative except where noted in HPI.   Physical Exam:  Constitutional: General:   Alert,  Well-developed, well-nourished, pleasant and cooperative in NAD BP (!) 136/91   Pulse 78   Temp 98.5 F (36.9 C) (Oral)   Ht 6' (1.829 m)   Wt 226 lb (102.5 kg)   BMI 30.65 kg/m   Eyes:  Sclera clear, no icterus.   Conjunctiva pink. PERRLA  Ears:  No scars, lesions or masses, Normal auditory acuity. Nose:  No deformity, discharge, or lesions. Mouth:  No deformity or lesions, oropharynx pink & moist.  Neck:  Supple; no masses or thyromegaly.  Respiratory: Normal respiratory effort, Normal percussion  Gastrointestinal: Soft, non-tender and non-distended without masses, hepatosplenomegaly or hernias noted.  No guarding or rebound tenderness.     Cardiac: No clubbing or edema.  No cyanosis. Normal posterior tibial pedal pulses noted.  Lymphatic:  No significant cervical or axillary adenopathy.  Psych:  Alert and cooperative. Normal mood and affect.  Musculoskeletal:  Normal gait. Head normocephalic, atraumatic.  Symmetrical without gross deformities. 5/5 Upper and Lower extremity strength bilaterally.  Skin: Warm. Intact without significant lesions or rashes. No jaundice.  Neurologic:  Face symmetrical, tongue midline, Normal sensation to touch;  grossly normal neurologically.  Psych:  Alert and oriented x3, Alert and cooperative. Normal mood and affect.   Labs: CBC    Component Value Date/Time   WBC 6.9 10/28/2020 1556   RBC 4.64 10/28/2020 1556   HGB 14.2 10/28/2020 1556   HCT 41.1 10/28/2020 1556   PLT 180 10/28/2020 1556   MCV 89 10/28/2020 1556   MCH 30.6 10/28/2020 1556   MCHC 34.5 10/28/2020 1556   RDW 13.1 10/28/2020 1556   LYMPHSABS 1.5 10/28/2020 1556   EOSABS 0.2 10/28/2020 1556   BASOSABS 0.0 10/28/2020 1556   CMP     Component Value Date/Time   NA 142 10/28/2020 1556   K 4.2 10/28/2020 1556   CL 105 10/28/2020 1556   CO2 23 10/28/2020 1556   GLUCOSE 105 (H) 10/28/2020 1556   BUN 16 10/28/2020 1556   CREATININE 0.88 10/28/2020 1556   CALCIUM 9.1 10/28/2020 1556   PROT 6.9 10/28/2020 1556   ALBUMIN 4.2 10/28/2020 1556   AST 14 10/28/2020 1556   ALT 20 10/28/2020 1556   ALKPHOS 59 10/28/2020 1556   BILITOT 0.4 10/28/2020 1556   GFRNONAA 88 12/17/2019 1610   GFRAA 102 12/17/2019 1610    Imaging Studies: No results found.  Assessment and Plan:   SHERIFF RODENBERG is a 70 y.o. y/o male has been referred for abdominal pain  Patient had abdominal pain in association with groin pain that has completely resolved and may have occurred after he lifted something  He does not have any chronic symptoms otherwise.  His pain may have been musculoskeletal in etiology and he is not having any further GI symptoms  Okay to proceed with screening colonoscopy at this time  No indication for EGD or imaging otherwise.  If symptoms recur, patient advised to call us verbalized understanding  Above labs otherwise reassuring  Dr Vonda Antigua  Speech recognition software was  used to dictate the above note.

## 2021-06-22 NOTE — Addendum Note (Signed)
Addended by: Lurlean Nanny on: 06/22/2021 05:25 PM   Modules accepted: Orders, SmartSet

## 2021-06-27 ENCOUNTER — Telehealth: Payer: Self-pay | Admitting: Gastroenterology

## 2021-06-27 ENCOUNTER — Telehealth: Payer: Self-pay

## 2021-06-27 NOTE — Telephone Encounter (Signed)
Message left for the patient to call the office to see about scheduling to see Dr Hampton [facility] in office on Wednesday.

## 2021-06-27 NOTE — Telephone Encounter (Signed)
-----   Message from Olean Ree, MD sent at 06/27/2021  2:02 PM EDT ----- Regarding: schedule appointment for Wednesday Hi,  Dr. Bonna Gains just messaged me about this patient having left groin pain, with history of prior hernia repair.  Can y'all please contact him to get him on my clinic tomorrow Wednesday.  I imagine we can try to fit him in in the latter part of the morning.  He's aware that he may need to wait a bit between patients due to how the schedule is as of now.  Thanks!  [employer]

## 2021-06-27 NOTE — Telephone Encounter (Signed)
Pt. Calling to see if he can get a call back he says he is still having some stomach pains from his last visit and would like to know if he needs to come back in for another appt or is it something he can take for it

## 2021-06-28 ENCOUNTER — Encounter: Payer: Self-pay | Admitting: Surgery

## 2021-06-28 ENCOUNTER — Other Ambulatory Visit: Payer: Self-pay

## 2021-06-28 ENCOUNTER — Ambulatory Visit (INDEPENDENT_AMBULATORY_CARE_PROVIDER_SITE_OTHER): Payer: PPO | Admitting: Surgery

## 2021-06-28 VITALS — BP 146/90 | HR 86 | Temp 98.1°F | Ht 73.0 in | Wt 225.6 lb

## 2021-06-28 DIAGNOSIS — R1030 Lower abdominal pain, unspecified: Secondary | ICD-10-CM | POA: Diagnosis not present

## 2021-06-28 NOTE — Progress Notes (Signed)
06/28/2021  Reason for Visit:  Lower abdominal and left groin pain  Requesting Provider:  Vonda Antigua, MD  History of Present Illness: Tyler Gregory is a 70 y.o. male presenting for evaluation of lower abdominal pain and left groin pain.  The patient has a history of open left inguinal hernia repair with Dr. Tamala Julian on 09/18/2011.  He reports that recently he has been doing more strenuous activity and doing more exertion, lifting heavy things.  He also has issues with constipation, and reports that he feels he does not empty all the stool with each bowel movement.  This causes crampy sensation in the lower abdomen.  He has seen Dr. Bonna Gains with GI and is scheduled for colonoscopy on 07/26/21.    With regards to the groin pain, he actually point much more inferior and posterior, towards the left ischial tuberosity.  He reports the pain is worse when he squats or bends forward/down.  The pain does not radiate and denies any shooting pain down his leg, or any paresthesia on his left leg.  Denies any bulging sensation in the left groin or pain in the area where his surgery was.  Past Medical History: Past Medical History:  Diagnosis Date   Asthma      Past Surgical History: Past Surgical History:  Procedure Laterality Date   INGUINAL HERNIA REPAIR Left 09/18/2011   KNEE SURGERY  1990    Home Medications: Prior to Admission medications   Medication Sig Start Date End Date Taking? Authorizing Provider  albuterol (VENTOLIN HFA) 108 (90 Base) MCG/ACT inhaler Inhale 2 puffs into the lungs every 6 (six) hours as needed for wheezing or shortness of breath. 05/30/20  Yes Chrismon, Vickki Muff, PA-C  fluticasone (FLONASE) 50 MCG/ACT nasal spray Place 1 spray into both nostrils daily. As needed   Yes [provider]    Allergies: Allergies  Allergen Reactions   Shrimp [Shellfish Allergy] Swelling    Social History:  reports that he has quit smoking. His smoking use included  cigarettes. He has never used smokeless tobacco. He reports current alcohol use of about 12.0 standard drinks per week. He reports that he does not currently use drugs.   Family History: Family History  Problem Relation Age of Onset   Breast cancer Mother    Emphysema Father     Review of Systems: Review of Systems  Constitutional:  Negative for chills and fever.  HENT:  Negative for hearing loss.   Respiratory:  Negative for shortness of breath.   Cardiovascular:  Negative for chest pain.  Gastrointestinal:  Positive for abdominal pain and constipation. Negative for blood in stool, diarrhea, nausea and vomiting.  Genitourinary:  Negative for dysuria.  Musculoskeletal:  Negative for myalgias.  Skin:  Negative for rash.  Neurological:  Negative for dizziness.  Psychiatric/Behavioral:  Negative for depression.    Physical Exam BP (!) 146/90   Pulse 86   Temp 98.1 F (36.7 C) (Oral)   Ht 6\' 1"  (1.854 m)   Wt 225 lb 9.6 oz (102.3 kg)   SpO2 92%   BMI 29.76 kg/m  CONSTITUTIONAL: No acute distress, well nourished. HEENT:  Normocephalic, atraumatic, extraocular motion intact. NECK: Trachea is midline, and there is no jugular venous distension.  RESPIRATORY:  Lungs are clear, and breath sounds are equal bilaterally. Normal respiratory effort without pathologic use of accessory muscles. CARDIOVASCULAR: Heart is regular without murmurs, gallops, or rubs. GI: The abdomen is soft, non-distended, currently non-tender to palpation.  On  exam, the left groin incision is well healed, and there's no evidence of hernia recurrence on the left side.  He may have a small/subtle right inguinal hernia, but this is asymptomatic. MUSCULOSKELETAL:  When palpating on the left ischial tuberosity, there is reproducible pain elicited and this is the area that the patient reports has pain.  I'm unable to feel any bulging or mass around the area where an obturator hernia could occur. SKIN: Skin turgor is  normal. There are no pathologic skin lesions.  NEUROLOGIC:  Motor and sensation is grossly normal.  Cranial nerves are grossly intact. PSYCH:  Alert and oriented to person, place and time. Affect is normal.  Laboratory Analysis: Labs from 10/28/20: Na 142, K 4.2, Cl 105, CO2 23, BUN 16, Cr 0.88.  LFTs within normal.  WBC 6.9, Hgb 14.2, Hct 41.1, Plt 180  Imaging: No pertinent imaging.  Assessment and Plan: This is a 70 y.o. male with lower abdominal pain and pain at the left ischial tuberosity.  --Discussed with the patient that his left inguinal hernia repair site is intact and there's no evidence of a recurrence.  He may have a small hernia in the right groin, but he is asymptomatic and denies any discomfort or bulging sensation there.  Discussed also that the area where his pain is is part of the left ischial tuberosity.  I am unable to palpate any potential obturator hernia in that area, and this type of hernia is much more unusual.  Likely, given the additional exertion and stress he's had, this could be musculoskeletal in nature.  Recommended that he take it easy with the exertion over the next few weeks and alternate heat/ice in that area.  With regards to his abdominal discomfort, recommended that he try MiraLax once daily to help with bowel movements and emptying.   --Patient is scheduled for colonoscopy on 07/26/21.  Discussed with him that if his left ischial tuberosity discomfort has not improved by then or if there's any worsening, to let us know so we can order a CT scan to further evaluate.  Since he's asymptomatic on the right groin, no urgency to pursue imaging there but if we have to get a CT for the left sided pain, we'll also be able to evaluate the right groin. --Patient is in agreement with this plan and all questions have been answered.  I spent 40 minutes dedicated to the care of this patient on the date of this encounter to include pre-visit review of records, face-to-face  time with the patient discussing diagnosis and management, and any post-visit coordination of care.   Melvyn Neth, [facility]

## 2021-06-28 NOTE — Patient Instructions (Addendum)
If you have any concerns or questions, please feel free to call our office. Follow up as needed.   Advised to pursue a goal of 25 to 30 g of fiber daily.  Made aware that the majority of this may be through natural sources, but advised to be aware of actual consumption and to ensure minimal consumption by daily supplementation.  Various forms of supplements discussed.  Strongly advised to consume more fluids to ensure adequate hydration, instructed to watch color of urine to determine adequacy of hydration.  Clarity is pursued in urine output, and bowel activity that correlates to significant meal intake.  Patient is to avoid deferring having bowel movements, advised to take the time at the first sign of sensation, typically following meals and in the morning.  Subsequent utilization of MiraLAX to ensure at least daily movement, ideally twice daily bowel movements.  If multiple doses of MiraLAX are necessary utilize them.    Inguinal Hernia, Adult  An inguinal hernia is when fat or your intestines push through a weak spot in a muscle where your leg meets your lower belly (groin). This causes a bulge. This kind of hernia could also be: In your scrotum, if you are male. In folds of skin around your vagina, if you are male. There are three types of inguinal hernias: Hernias that can be pushed back into the belly (are reducible). This type rarely causes pain. Hernias that cannot be pushed back into the belly (are incarcerated). Hernias that cannot be pushed back into the belly and lose their blood supply (are strangulated). This type needs emergency surgery. What are the causes? This condition is caused by having a weak spot in the muscles or tissues in your groin. This develops over time. The hernia may poke through the weak spot when you strain your lower belly muscles all of a sudden, such as when you: Lift a heavy object. Strain to poop (have a bowel movement). Trouble pooping (constipation) can  lead to straining. Cough. What increases the risk? This condition is more likely to develop in: Males. Pregnant females. People who: Are overweight. Work in jobs that require long periods of standing or heavy lifting. Have had an inguinal hernia before. Smoke or have lung disease. These factors can lead to long-term (chronic) coughing. What are the signs or symptoms? Symptoms may depend on the size of the hernia. Often, a small hernia has no symptoms. Symptoms of a larger hernia may include: A bulge in the groin area. This is easier to see when standing. You might not be able to see it when you are lying down. Pain or burning in the groin. This may get worse when you lift, strain, or cough. A dull ache or a feeling of pressure in the groin. An abnormal bulge in the scrotum, in males. Symptoms of a strangulated inguinal hernia may include: A bulge in your groin that is very painful and tender to the touch. A bulge that turns red or purple. Fever, feeling like you may vomit (nausea), and vomiting. Not being able to poop or to pass gas. How is this treated? Treatment depends on the size of your hernia and whether you have symptoms. If you do not have symptoms, your doctor may have you watch your hernia carefully and have you come in for follow-up visits. If your hernia is large or if you have symptoms, you may need surgery to repair the hernia. Follow these instructions at home: Lifestyle Avoid lifting heavy objects. Avoid standing  for long amounts of time. Do not smoke or use any products that contain nicotine or tobacco. If you need help quitting, ask your doctor. Stay at a healthy weight. Prevent trouble pooping You may need to take these actions to prevent or treat trouble pooping: Drink enough fluid to keep your pee (urine) pale yellow. Take over-the-counter or prescription medicines. Eat foods that are high in fiber. These include beans, whole grains, and fresh fruits and  vegetables. Limit foods that are high in fat and sugar. These include fried or sweet foods. General instructions You may try to push your hernia back in place by very gently pressing on it when you are lying down. Do not try to push the bulge back in if it will not go in easily. Watch your hernia for any changes in shape, size, or color. Tell your doctor if you see any changes. Take over-the-counter and prescription medicines only as told by your doctor. Keep all follow-up visits. Contact a doctor if: You have a fever or chills. You have new symptoms. Your symptoms get worse. Get help right away if: You have pain in your groin that gets worse all of a sudden. You have a bulge in your groin that: Gets bigger all of a sudden, and it does not get smaller after that. Turns red or purple. Is painful when you touch it. You are a male, and you have: Sudden pain in your scrotum. A sudden change in the size of your scrotum. You cannot push the hernia back in place by very gently pressing on it when you are lying down. You feel like you may vomit, and that feeling does not go away. You keep vomiting. You have a fast heartbeat. You cannot poop or pass gas. These symptoms may be an emergency. Get help right away. Call your local emergency services (911 in the [place] Do not wait to see if the symptoms will go away. Do not drive yourself to the hospital. Summary An inguinal hernia is when fat or your intestines push through a weak spot in a muscle where your leg meets your lower belly (groin). This causes a bulge. If you do not have symptoms, you may not need treatment. If you have symptoms or a large hernia, you may need surgery. Avoid lifting heavy objects. Also, avoid standing for long amounts of time. Do not try to push the bulge back in if it will not go in easily. This information is not intended to replace advice given to you by your health care provider. Make sure you discuss any questions  you have with your health care provider. Document Revised: 04/12/2020 Document Reviewed: 04/12/2020 Elsevier Patient Education  2022 [employer].

## 2021-06-29 ENCOUNTER — Encounter: Payer: Self-pay | Admitting: Surgery

## 2021-07-17 ENCOUNTER — Ambulatory Visit: Payer: PPO

## 2021-07-17 ENCOUNTER — Telehealth: Payer: Self-pay | Admitting: Gastroenterology

## 2021-07-17 DIAGNOSIS — R109 Unspecified abdominal pain: Secondary | ICD-10-CM

## 2021-07-17 NOTE — Telephone Encounter (Signed)
Patient wants nov 30th procedure cancelled and is still having pain between thighs and scrotum. Clinical staff will contact patient.

## 2021-07-18 NOTE — Telephone Encounter (Signed)
Left message on voicemail.

## 2021-07-19 NOTE — Telephone Encounter (Signed)
CT scan scheduled for Dec 16 at outpatient arrive at 3:30 NPO 4 hours prior... pt is aware and info sent via email per pt request

## 2021-07-24 NOTE — Telephone Encounter (Signed)
Called Endo to cancel procedure for 11/30

## 2021-07-26 ENCOUNTER — Ambulatory Visit: Admission: RE | Admit: 2021-07-26 | Payer: PPO | Source: Home / Self Care | Admitting: Gastroenterology

## 2021-07-26 ENCOUNTER — Encounter: Admission: RE | Payer: Self-pay | Source: Home / Self Care

## 2021-07-26 DIAGNOSIS — D225 Melanocytic nevi of trunk: Secondary | ICD-10-CM | POA: Diagnosis not present

## 2021-07-26 DIAGNOSIS — L821 Other seborrheic keratosis: Secondary | ICD-10-CM | POA: Diagnosis not present

## 2021-07-26 SURGERY — COLONOSCOPY WITH PROPOFOL
Anesthesia: General

## 2021-08-01 ENCOUNTER — Ambulatory Visit (INDEPENDENT_AMBULATORY_CARE_PROVIDER_SITE_OTHER): Payer: PPO

## 2021-08-01 DIAGNOSIS — Z0000 Encounter for general adult medical examination without abnormal findings: Secondary | ICD-10-CM | POA: Diagnosis not present

## 2021-08-01 NOTE — Patient Instructions (Signed)
Tyler Gregory , Thank you for taking time to come for your Medicare Wellness Visit. I appreciate your ongoing commitment to your health goals. Please review the following plan we discussed and let me know if I can assist you in the future.   Screening recommendations/referrals: Colonoscopy: will reschedule this coming month (was set for last week, but he is having CT scan next week) Recommended yearly ophthalmology/optometry visit for glaucoma screening and checkup Recommended yearly dental visit for hygiene and checkup  Vaccinations: Influenza vaccine: due Pneumococcal vaccine: 08/18/20 Tdap vaccine: n/d Shingles vaccine: n/d   Covid-19: n/d  Advanced directives: no  Conditions/risks identified:   Next appointment: Follow up in one year for your annual wellness visit.   Preventive Care 69 Years and Older, Male Preventive care refers to lifestyle choices and visits with your health care provider that can promote health and wellness. What does preventive care include? A yearly physical exam. This is also called an annual well check. Dental exams once or twice a year. Routine eye exams. Ask your health care provider how often you should have your eyes checked. Personal lifestyle choices, including: Daily care of your teeth and gums. Regular physical activity. Eating a healthy diet. Avoiding tobacco and drug use. Limiting alcohol use. Practicing safe sex. Taking low doses of aspirin every day. Taking vitamin and mineral supplements as recommended by your health care provider. What happens during an annual well check? The services and screenings done by your health care provider during your annual well check will depend on your age, overall health, lifestyle risk factors, and family history of disease. Counseling  Your health care provider may ask you questions about your: Alcohol use. Tobacco use. Drug use. Emotional well-being. Home and relationship well-being. Sexual  activity. Eating habits. History of falls. Memory and ability to understand (cognition). Work and work Statistician. Screening  You may have the following tests or measurements: Height, weight, and BMI. Blood pressure. Lipid and cholesterol levels. These may be checked every 5 years, or more frequently if you are over 8 years old. Skin check. Lung cancer screening. You may have this screening every year starting at age 69 if you have a 30-pack-year history of smoking and currently smoke or have quit within the past 15 years. Fecal occult blood test (FOBT) of the stool. You may have this test every year starting at age 69. Flexible sigmoidoscopy or colonoscopy. You may have a sigmoidoscopy every 5 years or a colonoscopy every 10 years starting at age 69. Prostate cancer screening. Recommendations will vary depending on your family history and other risks. Hepatitis C blood test. Hepatitis B blood test. Sexually transmitted disease (STD) testing. Diabetes screening. This is done by checking your blood sugar (glucose) after you have not eaten for a while (fasting). You may have this done every 1-3 years. Abdominal aortic aneurysm (AAA) screening. You may need this if you are a current or former smoker. Osteoporosis. You may be screened starting at age 69 if you are at high risk. Talk with your health care provider about your test results, treatment options, and if necessary, the need for more tests. Vaccines  Your health care provider may recommend certain vaccines, such as: Influenza vaccine. This is recommended every year. Tetanus, diphtheria, and acellular pertussis (Tdap, Td) vaccine. You may need a Td booster every 10 years. Zoster vaccine. You may need this after age 69. Pneumococcal 13-valent conjugate (PCV13) vaccine. One dose is recommended after age 69. Pneumococcal polysaccharide (PPSV23) vaccine. One dose  is recommended after age 69. Talk to your health care provider about which  screenings and vaccines you need and how often you need them. This information is not intended to replace advice given to you by your health care provider. Make sure you discuss any questions you have with your health care provider. Document Released: 09/09/2015 Document Revised: 05/02/2016 Document Reviewed: 06/14/2015 Elsevier Interactive Patient Education  2017 [place] Prevention in the Home Falls can cause injuries. They can happen to people of all ages. There are many things you can do to make your home safe and to help prevent falls. What can I do on the outside of my home? Regularly fix the edges of walkways and driveways and fix any cracks. Remove anything that might make you trip as you walk through a door, such as a raised step or threshold. Trim any bushes or trees on the path to your home. Use bright outdoor lighting. Clear any walking paths of anything that might make someone trip, such as rocks or tools. Regularly check to see if handrails are loose or broken. Make sure that both sides of any steps have handrails. Any raised decks and porches should have guardrails on the edges. Have any leaves, snow, or ice cleared regularly. Use sand or salt on walking paths during winter. Clean up any spills in your garage right away. This includes oil or grease spills. What can I do in the bathroom? Use night lights. Install grab bars by the toilet and in the tub and shower. Do not use towel bars as grab bars. Use non-skid mats or decals in the tub or shower. If you need to sit down in the shower, use a plastic, non-slip stool. Keep the floor dry. Clean up any water that spills on the floor as soon as it happens. Remove soap buildup in the tub or shower regularly. Attach bath mats securely with double-sided non-slip rug tape. Do not have throw rugs and other things on the floor that can make you trip. What can I do in the bedroom? Use night lights. Make sure that you have a  light by your bed that is easy to reach. Do not use any sheets or blankets that are too big for your bed. They should not hang down onto the floor. Have a firm chair that has side arms. You can use this for support while you get dressed. Do not have throw rugs and other things on the floor that can make you trip. What can I do in the kitchen? Clean up any spills right away. Avoid walking on wet floors. Keep items that you use a lot in easy-to-reach places. If you need to reach something above you, use a strong step stool that has a grab bar. Keep electrical cords out of the way. Do not use floor polish or wax that makes floors slippery. If you must use wax, use non-skid floor wax. Do not have throw rugs and other things on the floor that can make you trip. What can I do with my stairs? Do not leave any items on the stairs. Make sure that there are handrails on both sides of the stairs and use them. Fix handrails that are broken or loose. Make sure that handrails are as long as the stairways. Check any carpeting to make sure that it is firmly attached to the stairs. Fix any carpet that is loose or worn. Avoid having throw rugs at the top or bottom of the stairs.  If you do have throw rugs, attach them to the floor with carpet tape. Make sure that you have a light switch at the top of the stairs and the bottom of the stairs. If you do not have them, ask someone to add them for you. What else can I do to help prevent falls? Wear shoes that: Do not have high heels. Have rubber bottoms. Are comfortable and fit you well. Are closed at the toe. Do not wear sandals. If you use a stepladder: Make sure that it is fully opened. Do not climb a closed stepladder. Make sure that both sides of the stepladder are locked into place. Ask someone to hold it for you, if possible. Clearly mark and make sure that you can see: Any grab bars or handrails. First and last steps. Where the edge of each step  is. Use tools that help you move around (mobility aids) if they are needed. These include: Canes. Walkers. Scooters. Crutches. Turn on the lights when you go into a dark area. Replace any light bulbs as soon as they burn out. Set up your furniture so you have a clear path. Avoid moving your furniture around. If any of your floors are uneven, fix them. If there are any pets around you, be aware of where they are. Review your medicines with your doctor. Some medicines can make you feel dizzy. This can increase your chance of falling. Ask your doctor what other things that you can do to help prevent falls. This information is not intended to replace advice given to you by your health care provider. Make sure you discuss any questions you have with your health care provider. Document Released: 06/09/2009 Document Revised: 01/19/2016 Document Reviewed: 09/17/2014 Elsevier Interactive Patient Education  2017 [employer].

## 2021-08-01 NOTE — Progress Notes (Signed)
Virtual Visit via Telephone Note  I connected with  Blanch Media on 08/01/21 at  9:40 AM EST by telephone and verified that I am speaking with the correct person using two identifiers.  Location: Patient: home Provider: [facility] Persons participating in the virtual visit: Joffre   I discussed the limitations, risks, security and privacy concerns of performing an evaluation and management service by telephone and the availability of in person appointments. The patient expressed understanding and agreed to proceed.  Interactive audio and video telecommunications were attempted between this nurse and patient, however failed, due to patient having technical difficulties OR patient did not have access to video capability.  We continued and completed visit with audio only.  Some vital signs may be absent or patient reported.   Tyler David, LPN  Subjective:   Tyler Gregory is a 70 y.o. male who presents for Medicare Annual/Subsequent preventive examination.  Review of Systems           Objective:    There were no vitals filed for this visit. There is no height or weight on file to calculate BMI.  Advanced Directives 08/01/2021 07/11/2020 07/11/2020 06/17/2019  Does Patient Have a Medical Advance Directive? No No No No  Would patient like information on creating a medical advance directive? No - Patient declined No - Patient declined No - Patient declined No - Patient declined    Current Medications (verified) Outpatient Encounter Medications as of 08/01/2021  Medication Sig   albuterol (VENTOLIN HFA) 108 (90 Base) MCG/ACT inhaler Inhale 2 puffs into the lungs every 6 (six) hours as needed for wheezing or shortness of breath.   fluticasone (FLONASE) 50 MCG/ACT nasal spray Place 1 spray into both nostrils daily. As needed   No facility-administered encounter medications on file as of 08/01/2021.    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  Tobacco Use   Smoking status: Former    Types: Cigarettes   Smokeless tobacco: Never   Tobacco comments:    on and off; quit around 2000  Vaping Use   Vaping Use: Never used  Substance and Sexual Activity   Alcohol use: Yes    Alcohol/week: 12.0 standard drinks    Types: 12 Cans of beer per week    Comment: 0-2 beers a day   Drug use: Not Currently   Sexual activity: Not on file  Other Topics Concern   Not on file  Social History Narrative   Not on file   Social Determinants of Health   Financial Resource Strain: Low Risk    Difficulty of Paying Living Expenses: Not hard at all  Food Insecurity: No Food Insecurity   Worried About [place] in the Last Year: Never true   [place] in the  Last Year: Never true  Transportation Needs: No Transportation Needs   Lack of Transportation (Medical): No   Lack of Transportation (Non-Medical): No  Physical Activity: Sufficiently Active   Days of Exercise per Week: 4 days   Minutes of Exercise per Session: 40 min  Stress: No Stress Concern Present   Feeling of Stress : Only a little  Social Connections: Socially Isolated   Frequency of Communication with Friends and Family: More than three times a week   Frequency of Social Gatherings with Friends and Family: Once a week   Attends Religious Services: Never   Marine scientist or  Organizations: No   Attends Music therapist: Never   Marital Status: Divorced    Tobacco Counseling Counseling given: Not Answered Tobacco comments: on and off; quit around 2000   Clinical Intake:  Pre-visit preparation completed: Yes  Pain : No/denies pain     Nutritional Risks: None Diabetes: No  How often do you need to have someone help you when you read instructions, pamphlets, or other written materials from your doctor or pharmacy?: 1 - Never  Diabetic?no  Interpreter Needed?: No  Information entered by :: Kirke Shaggy, LPN   Activities of Daily Living No flowsheet data found.  Patient Care Team: Pcp, No as PCP - General Arelia Sneddon, OD (Optometry)  Indicate any recent Medical Services you may have received from other than [facility] providers in the past year (date may be approximate).     Assessment:   This is a routine wellness examination for Tyler Gregory.  Hearing/Vision screen No results found.  Dietary issues and exercise activities discussed:     Goals Addressed             This Visit's Progress    DIET - EAT MORE FRUITS AND VEGETABLES         Depression Screen PHQ 2/9 Scores 08/01/2021 07/11/2020 12/17/2019 08/21/2018  PHQ - 2 Score 0 0 0 0    Fall Risk Fall Risk  08/01/2021 07/11/2020 12/17/2019 08/21/2018  Falls in the past year? 0 0 0 0  Number falls in past yr: 0 0 0 -  Injury with Fall? 0 0 0 -  Risk for fall due to : No Fall Risks - - -  Follow up Falls prevention discussed - - -    FALL RISK PREVENTION PERTAINING TO THE HOME:  Any stairs in or around the home? No  If so, are there any without handrails? No  Home free of loose throw rugs in walkways, pet beds, electrical cords, etc? Yes  Adequate lighting in your home to reduce risk of falls? Yes   ASSISTIVE DEVICES UTILIZED TO PREVENT FALLS:  Life alert? No  Use of a cane, walker or w/c? No  Grab bars in the bathroom? No  Shower chair or bench in shower? Yes   Elevated toilet seat or a handicapped toilet? No   TIMED UP AND GO:  Was the test performed? No .   Cognitive Function:Normal cognitive status assessed by direct observation by this Nurse Health Advisor. No abnormalities found.          Immunizations Immunization History  Administered Date(s) Administered   Fluad Quad(high Dose 65+) 06/04/2019, 06/14/2020   Pneumococcal Conjugate-13 08/18/2020    TDAP status: Due, Education has been provided regarding the importance of this vaccine. Advised may receive this vaccine at local pharmacy or Health Dept. Aware to provide a copy of the vaccination record if obtained from  local pharmacy or Health Dept. Verbalized acceptance and understanding.  Flu Vaccine status: Up to date  Pneumococcal vaccine status: Up to date  Covid-19 vaccine status: Declined, Education has been provided regarding the importance of this vaccine but patient still declined. Advised may receive this vaccine at local pharmacy or Health Dept.or vaccine clinic. Aware to provide a copy of the vaccination record if obtained from local pharmacy or Health Dept. Verbalized acceptance and understanding.  Qualifies for Shingles Vaccine? No   Zostavax completed No   Shingrix Completed?: No.    Education has been provided regarding the importance of this vaccine. Patient has been advised to call insurance company to determine out of pocket expense if they have not yet received this vaccine. Advised may also receive vaccine at local pharmacy or Health Dept. Verbalized acceptance and understanding.  Screening Tests Health Maintenance  Topic Date Due   COVID-19 Vaccine (1) Never done   Hepatitis C Screening  Never done   TETANUS/TDAP  Never done   COLONOSCOPY (Pts 45-77yrs Insurance coverage will need to be confirmed)  Never done   Zoster Vaccines- Shingrix (1 of 2) Never done   INFLUENZA VACCINE  03/27/2021   Pneumonia Vaccine 57+ Years old (2 - PPSV23 if available, else PCV20)  08/18/2021   HPV VACCINES  Aged Out    Health Maintenance  Health Maintenance Due  Topic Date Due   COVID-19 Vaccine (1) Never done   Hepatitis C Screening  Never done   TETANUS/TDAP  Never done   COLONOSCOPY (Pts 45-54yrs Insurance coverage will need to be confirmed)  Never done   Zoster Vaccines- Shingrix (1 of 2) Never done   INFLUENZA VACCINE  03/27/2021   Pneumonia Vaccine 74+ Years old (2 - PPSV23 if available, else PCV20) 08/18/2021    Colorectal cancer screening: Type of screening: Colonoscopy. Completed will reschedule for this coming month. Repeat every 10 years  Lung Cancer Screening: (Low Dose CT Chest recommended if Age 72-80 years, 30 pack-year currently smoking OR have quit w/in 15years.) does not qualify.    Additional Screening:  Hepatitis C Screening: does qualify; Completed no  Vision Screening: Recommended annual ophthalmology exams for early detection of glaucoma and other disorders of the eye. Is the patient up to date with their annual eye exam?  Yes  Who is the provider or what is the name of the office in which the patient attends annual eye exams? [facility] If pt is not established with a provider, would they like to be referred to a provider to establish care? No .   Dental Screening: Recommended annual dental exams for proper oral hygiene  Community Resource Referral / Chronic Care Management: CRR required this visit?  No   CCM required this visit?  No      Plan:     I have personally reviewed and noted the following in the patient's chart:   Medical and social history Use of alcohol, tobacco or illicit drugs  Current medications and supplements including opioid prescriptions. Patient is not currently taking opioid prescriptions. Functional ability and status Nutritional status Physical activity Advanced directives List of other physicians Hospitalizations, surgeries, and ER visits in previous 12 months Vitals Screenings to  include cognitive, depression, and falls Referrals and appointments  In addition, I have reviewed and discussed with patient certain preventive protocols, quality metrics, and best practice recommendations. A written personalized care plan for preventive services as well as general preventive health recommendations were provided to patient.  Tyler David, LPN   [id number]   Nurse Notes: none

## 2021-08-08 ENCOUNTER — Encounter: Payer: Self-pay | Admitting: Family Medicine

## 2021-08-08 ENCOUNTER — Ambulatory Visit (INDEPENDENT_AMBULATORY_CARE_PROVIDER_SITE_OTHER): Payer: PPO | Admitting: Family Medicine

## 2021-08-08 ENCOUNTER — Other Ambulatory Visit: Payer: Self-pay

## 2021-08-08 VITALS — BP 132/73 | HR 69 | Resp 15 | Wt 232.5 lb

## 2021-08-08 DIAGNOSIS — Z23 Encounter for immunization: Secondary | ICD-10-CM | POA: Diagnosis not present

## 2021-08-08 DIAGNOSIS — Z125 Encounter for screening for malignant neoplasm of prostate: Secondary | ICD-10-CM

## 2021-08-08 DIAGNOSIS — E782 Mixed hyperlipidemia: Secondary | ICD-10-CM | POA: Diagnosis not present

## 2021-08-08 DIAGNOSIS — F1010 Alcohol abuse, uncomplicated: Secondary | ICD-10-CM | POA: Diagnosis not present

## 2021-08-08 DIAGNOSIS — K4090 Unilateral inguinal hernia, without obstruction or gangrene, not specified as recurrent: Secondary | ICD-10-CM

## 2021-08-08 DIAGNOSIS — R739 Hyperglycemia, unspecified: Secondary | ICD-10-CM | POA: Diagnosis not present

## 2021-08-08 DIAGNOSIS — Z1159 Encounter for screening for other viral diseases: Secondary | ICD-10-CM | POA: Diagnosis not present

## 2021-08-08 DIAGNOSIS — R0602 Shortness of breath: Secondary | ICD-10-CM | POA: Diagnosis not present

## 2021-08-08 DIAGNOSIS — R1032 Left lower quadrant pain: Secondary | ICD-10-CM | POA: Insufficient documentation

## 2021-08-08 DIAGNOSIS — Z13220 Encounter for screening for lipoid disorders: Secondary | ICD-10-CM

## 2021-08-08 DIAGNOSIS — R5382 Chronic fatigue, unspecified: Secondary | ICD-10-CM | POA: Diagnosis not present

## 2021-08-08 DIAGNOSIS — Z131 Encounter for screening for diabetes mellitus: Secondary | ICD-10-CM

## 2021-08-08 MED ORDER — ZOSTER VAC RECOMB ADJUVANTED 50 MCG/0.5ML IM SUSR: 0.5000 mL | Freq: Once | INTRAMUSCULAR | 0 refills | Status: AC

## 2021-08-08 NOTE — Assessment & Plan Note (Signed)
Rx provided for 1st dose SO just had shingles and continues to have pain

## 2021-08-08 NOTE — Assessment & Plan Note (Signed)
Encouraged to try to use albuterol prior to ambulation and report back on change in symptoms Potential allergen source  Denies referral to pulmonary at this time

## 2021-08-08 NOTE — Assessment & Plan Note (Signed)
Repeat chemistry

## 2021-08-08 NOTE — Assessment & Plan Note (Signed)
A1c given serum elevations seen on chem panel; and RF given obesity and age

## 2021-08-08 NOTE — Assessment & Plan Note (Signed)
Lab for previous elevation

## 2021-08-08 NOTE — Assessment & Plan Note (Signed)
Repeat lipid panel ?

## 2021-08-08 NOTE — Progress Notes (Signed)
Established patient visit   Patient: Tyler Gregory   DOB: 17-Jun-1951   70 y.o. Male  MRN: [id number] Visit Date: 08/08/2021  Today's healthcare provider: Gwyneth Sprout, FNP   Chief Complaint  Patient presents with   Consult    Patient presents in office today to establish care with new PCP and discuss his health concerns.    Subjective    HPI HPI     Consult    Additional comments: Patient presents in office today to establish care with new PCP and discuss his health concerns.       Last edited by Minette Headland, CMA on 08/08/2021  8:09 AM.        Medications: Outpatient Medications Prior to Visit  Medication Sig   albuterol (VENTOLIN HFA) 108 (90 Base) MCG/ACT inhaler Inhale 2 puffs into the lungs every 6 (six) hours as needed for wheezing or shortness of breath.   fluticasone (FLONASE) 50 MCG/ACT nasal spray Place 1 spray into both nostrils daily. As needed   No facility-administered medications prior to visit.    Review of Systems     Objective    BP 132/73   Pulse 69   Resp 15   Wt 232 lb 8 oz (105.5 kg)   SpO2 95%   BMI 30.67 kg/m    Physical Exam Vitals and nursing note reviewed.  Constitutional:      Appearance: Normal appearance. He is obese.  HENT:     Head: Normocephalic and atraumatic.  Eyes:     Pupils: Pupils are equal, round, and reactive to light.  Cardiovascular:     Rate and Rhythm: Normal rate and regular rhythm.     Pulses: Normal pulses.     Heart sounds: Normal heart sounds.  Pulmonary:     Effort: Pulmonary effort is normal.     Breath sounds: Normal breath sounds.  Abdominal:     General: Bowel sounds are normal. There is no distension.     Palpations: Abdomen is soft. There is no mass.     Tenderness: There is no abdominal tenderness. There is no guarding or rebound.     Hernia: No hernia is present.  Musculoskeletal:        General: Normal range of motion.     Cervical back: Normal range of motion.  Skin:     General: Skin is warm and dry.     Capillary Refill: Capillary refill takes less than 2 seconds.  Neurological:     General: No focal deficit present.     Mental Status: He is alert and oriented to person, place, and time. Mental status is at baseline.  Psychiatric:        Mood and Affect: Mood normal.        Behavior: Behavior normal.        Thought Content: Thought content normal.        Judgment: Judgment normal.      No results found for any visits on 08/08/21.  Assessment & Plan     Problem List Items Addressed This Visit       Other   Shortness of breath - Primary    Encouraged to try to use albuterol prior to ambulation and report back on change in symptoms Potential allergen source  Denies referral to pulmonary at this time      Relevant Orders   CBC with Differential/Platelet   Left lower quadrant abdominal pain  Intermittent in nature; no pain today Hx of L hernia repair No bulging on exam with bearing down Denies change in stool patterns Has an upcoming CT scan      Inguinal hernia without obstruction or gangrene    Hx of L inguinal hernia s/p repair; continues to cause abdominal pain CT scan pending Has been to both gastro and surgery cx      Need for shingles vaccine    Rx provided for 1st dose SO just had shingles and continues to have pain      Relevant Medications   Zoster Vaccine Adjuvanted [facility]) injection   Elevated serum glucose    Repeat chemistry      Relevant Orders   Hemoglobin A1c   Elevated triglycerides with high cholesterol    Repeat lipid panel      Relevant Orders   Lipid panel   Screening for prostate cancer    Denies concerns with BPH Defer DRE PSA for screening      Relevant Orders   PSA Total (Reflex To Free)   Screening, lipid    Lab for previous elevation      Relevant Orders   Lipid panel   Screening for diabetes mellitus    A1c given serum elevations seen on chem panel; and RF given obesity and  age      Relevant Orders   Hemoglobin A1c   Chronic fatigue    New concern- encouraged to seek additional OV to discuss further      Relevant Orders   CBC with Differential/Platelet   Alcohol abuse   Relevant Orders   Comprehensive metabolic panel   Hepatic function panel   Encounter for hepatitis C screening test for low risk patient    Low risk screening      Relevant Orders   Hepatitis C Antibody   Other Visit Diagnoses     Need for pneumococcal vaccination       Relevant Orders   Pneumococcal polysaccharide vaccine 23-valent greater than or equal to 2yo subcutaneous/IM (Completed)   Need for tetanus booster       Relevant Orders   Tdap vaccine greater than or equal to 7yo IM (Completed)        Return for annual examination, chonic disease management.      Vonna Kotyk, FNP, have reviewed all documentation for this visit. The documentation on 08/08/21 for the exam, diagnosis, procedures, and orders are all accurate and complete.    Gwyneth Sprout, [place] [phone] (phone) [phone] (fax)  [place]

## 2021-08-08 NOTE — Assessment & Plan Note (Signed)
Hx of L inguinal hernia s/p repair; continues to cause abdominal pain CT scan pending Has been to both gastro and surgery cx

## 2021-08-08 NOTE — Assessment & Plan Note (Signed)
New concern- encouraged to seek additional OV to discuss further

## 2021-08-08 NOTE — Assessment & Plan Note (Signed)
Intermittent in nature; no pain today Hx of L hernia repair No bulging on exam with bearing down Denies change in stool patterns Has an upcoming CT scan

## 2021-08-08 NOTE — Assessment & Plan Note (Signed)
Low risk screening

## 2021-08-08 NOTE — Assessment & Plan Note (Signed)
Denies concerns with BPH Defer DRE PSA for screening

## 2021-08-09 ENCOUNTER — Other Ambulatory Visit: Payer: Self-pay | Admitting: Family Medicine

## 2021-08-09 LAB — CBC WITH DIFFERENTIAL/PLATELET
Basophils Absolute: 0 10*3/uL (ref 0.0–0.2)
Basos: 1 %
EOS (ABSOLUTE): 0.3 10*3/uL (ref 0.0–0.4)
Eos: 5 %
Hematocrit: 46.3 % (ref 37.5–51.0)
Hemoglobin: 15.8 g/dL (ref 13.0–17.7)
Immature Grans (Abs): 0 10*3/uL (ref 0.0–0.1)
Immature Granulocytes: 0 %
Lymphocytes Absolute: 1.5 10*3/uL (ref 0.7–3.1)
Lymphs: 27 %
MCH: 30 pg (ref 26.6–33.0)
MCHC: 34.1 g/dL (ref 31.5–35.7)
MCV: 88 fL (ref 79–97)
Monocytes Absolute: 0.5 10*3/uL (ref 0.1–0.9)
Monocytes: 9 %
Neutrophils Absolute: 3.2 10*3/uL (ref 1.4–7.0)
Neutrophils: 58 %
Platelets: 183 10*3/uL (ref 150–450)
RBC: 5.26 x10E6/uL (ref 4.14–5.80)
RDW: 12 % (ref 11.6–15.4)
WBC: 5.4 10*3/uL (ref 3.4–10.8)

## 2021-08-09 LAB — COMPREHENSIVE METABOLIC PANEL
ALT: 26 IU/L (ref 0–44)
AST: 20 IU/L (ref 0–40)
Albumin/Globulin Ratio: 1.8 (ref 1.2–2.2)
Albumin: 4.5 g/dL (ref 3.8–4.8)
Alkaline Phosphatase: 58 IU/L (ref 44–121)
BUN/Creatinine Ratio: 16 (ref 10–24)
BUN: 16 mg/dL (ref 8–27)
Bilirubin Total: 0.4 mg/dL (ref 0.0–1.2)
CO2: 22 mmol/L (ref 20–29)
Calcium: 9.4 mg/dL (ref 8.6–10.2)
Chloride: 101 mmol/L (ref 96–106)
Creatinine, Ser: 1 mg/dL (ref 0.76–1.27)
Globulin, Total: 2.5 g/dL (ref 1.5–4.5)
Glucose: 157 mg/dL — ABNORMAL HIGH (ref 70–99)
Potassium: 4.3 mmol/L (ref 3.5–5.2)
Sodium: 138 mmol/L (ref 134–144)
Total Protein: 7 g/dL (ref 6.0–8.5)
eGFR: 82 mL/min/{1.73_m2} (ref >59)

## 2021-08-09 LAB — HEMOGLOBIN A1C
Est. average glucose Bld gHb Est-mCnc: 114 mg/dL
Hgb A1c MFr Bld: 5.6 % (ref 4.8–5.6)

## 2021-08-09 LAB — LIPID PANEL
Chol/HDL Ratio: 5.2 ratio — ABNORMAL HIGH (ref 0.0–5.0)
Cholesterol, Total: 203 mg/dL — ABNORMAL HIGH (ref 100–199)
HDL: 39 mg/dL — ABNORMAL LOW (ref >39)
LDL Chol Calc (NIH): 110 mg/dL — ABNORMAL HIGH (ref 0–99)
Triglycerides: 311 mg/dL — ABNORMAL HIGH (ref 0–149)
VLDL Cholesterol Cal: 54 mg/dL — ABNORMAL HIGH (ref 5–40)

## 2021-08-09 LAB — HEPATIC FUNCTION PANEL: Bilirubin, Direct: 0.12 mg/dL (ref 0.00–0.40)

## 2021-08-09 LAB — HEPATITIS C ANTIBODY: Hep C Virus Ab: 0.2 s/co ratio (ref 0.0–0.9)

## 2021-08-09 LAB — PSA TOTAL (REFLEX TO FREE): Prostate Specific Ag, Serum: 0.9 ng/mL (ref 0.0–4.0)

## 2021-08-09 MED ORDER — ATORVASTATIN CALCIUM 80 MG PO TABS: 80.0000 mg | ORAL_TABLET | Freq: Every day | ORAL | 3 refills | Status: AC

## 2021-08-09 NOTE — Progress Notes (Signed)
Hello,    Your lab results have returned. It was a pleasure to see you in the office the other day.  Total cholesterol elevated; good/HDL cholesterol low; bad/LDL cholesterol elevated. Current risk of heart attack/stroke is The 10-year ASCVD risk score (Arnett DK, et al., 2019) is: 18.8%   Values used to calculate the score:     Age: 70 years     Sex: Male     Is Non-Hispanic African American: No     Diabetic: No     Tobacco smoker: No     Systolic Blood Pressure: 017 mmHg     Is BP treated: No     HDL Cholesterol: 39 mg/dL     Total Cholesterol: 203 mg/dL  I would recommend that we start a cholesterol medication to reduce this risk of heart attack/stroke as we work on diet/exercise. Plan to check labs again in 3-6 months. Recommend increase in diet of healthier fat choices- low fat meats, oils that are not solid at room temperature, nuts, seeds, fish- cod, halibut, salmon, and avocado. Exercise can also increase this number.  Supplemental omega 3's can be taken as well but are not as helpful as dietary/exercise changes.   PSA stable, no pre-diabetes, cell count stable, kidney/liver function stable.  Please let us know if you have any questions.  Thank you,  Tally Joe, FNP

## 2021-08-11 ENCOUNTER — Ambulatory Visit
Admission: RE | Admit: 2021-08-11 | Discharge: 2021-08-11 | Disposition: A | Discharge status: Home / Self Care | Payer: PPO | Source: Ambulatory Visit | Attending: Gastroenterology | Admitting: Gastroenterology

## 2021-08-11 ENCOUNTER — Other Ambulatory Visit: Payer: Self-pay

## 2021-08-11 DIAGNOSIS — R109 Unspecified abdominal pain: Secondary | ICD-10-CM

## 2021-08-11 MED ORDER — IOHEXOL 350 MG/ML SOLN
100.0000 mL | Freq: Once | INTRAVENOUS | Status: AC | PRN
  Administered 2021-08-11: 100 mL via INTRAVENOUS

## 2021-08-16 NOTE — Progress Notes (Signed)
Patient aware that prescription for Atorvastatin was sent in 12/15 to [facility] [address] [facility].

## 2021-08-28 ENCOUNTER — Ambulatory Visit: Payer: PPO | Admitting: Surgery

## 2021-09-01 ENCOUNTER — Other Ambulatory Visit: Payer: Self-pay

## 2021-09-01 ENCOUNTER — Encounter: Payer: Self-pay | Admitting: Surgery

## 2021-09-01 ENCOUNTER — Telehealth: Payer: Self-pay

## 2021-09-01 ENCOUNTER — Ambulatory Visit: Payer: PPO | Admitting: Surgery

## 2021-09-01 VITALS — BP 143/80 | HR 73 | Temp 99.2°F | Ht 72.0 in | Wt 229.6 lb

## 2021-09-01 DIAGNOSIS — K4090 Unilateral inguinal hernia, without obstruction or gangrene, not specified as recurrent: Secondary | ICD-10-CM

## 2021-09-01 NOTE — Progress Notes (Signed)
09/01/2021  History of Present Illness: Tyler Gregory is a 71 y.o. male presenting for follow-up of lower abdominal discomfort.  The patient was seen last on 06/28/2021.  At that point, the pain was reported to be more in the left posterior groin area towards the ischial tuberosity but no hernia was palpable in the left groin itself.  He reports that that pain has been improving with time and is almost resolved.  He did have a CT scan of the abdomen pelvis on 08/11/2021 which did not show any findings in the left groin but did show a right inguinal hernia containing a small loop of small bowel without any evidence of bowel obstruction.  Although not mentioned on the CT report, it appears that he does have a very small umbilical hernia as well.  I have personally viewed the images.  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  Past Medical History: Past Medical History:  Diagnosis Date   Asthma      Past Surgical History: Past Surgical History:  Procedure Laterality Date   INGUINAL HERNIA REPAIR Left 09/18/2011   KNEE SURGERY  1990    Home Medications: Prior to Admission medications   Medication Sig Start Date End Date Taking? Authorizing Provider  albuterol (VENTOLIN HFA) 108 (90 Base) MCG/ACT inhaler Inhale 2 puffs into the lungs every 6 (six) hours as needed for wheezing or shortness of breath. 05/30/20  Yes Chrismon, Vickki Muff, PA-C  atorvastatin (LIPITOR) 80 MG tablet Take 1 tablet (80 mg total) by mouth daily. 08/09/21  Yes Gwyneth Sprout, FNP  fluticasone (FLONASE) 50 MCG/ACT nasal spray Place 1 spray into both nostrils daily. As needed   Yes [provider]    Allergies: Allergies  Allergen Reactions   Shrimp [Shellfish Allergy] Swelling    Review of Systems: Review of Systems  Constitutional:  Negative for  chills and fever.  Respiratory:  Negative for shortness of breath.   Cardiovascular:  Negative for chest pain.  Gastrointestinal:  Negative for abdominal pain, nausea and vomiting.  Skin:  Negative for rash.   Physical Exam BP (!) 143/80    Pulse 73    Temp 99.2 F (37.3 C) (Oral)    Ht 6' (1.829 m)    Wt 229 lb 9.6 oz (104.1 kg)    SpO2 92%    BMI 31.14 kg/m  CONSTITUTIONAL: No acute distress HEENT:  Normocephalic, atraumatic, extraocular motion intact. RESPIRATORY:  Lungs are clear, and breath sounds are equal bilaterally. Normal respiratory effort without pathologic use of accessory muscles. CARDIOVASCULAR: Heart is regular without murmurs, gallops, or rubs. GI: The abdomen is soft, nondistended, nontender to palpation.  Patient does have some mild discomfort in the lower inner groin going towards the upper inner thigh area which is the same location as previously.  However no evidence of left groin hernia recurrence on exam.  On the right inguinal area, he does have a subtle inguinal hernia which is easily reducible.  He does have a very small umbilical hernia. NEUROLOGIC:  Motor and sensation is grossly normal.  Cranial nerves are grossly intact. PSYCH:  Alert and oriented to person, place and time. Affect is normal.  Labs/Imaging: Labs from 08/08/2021: Sodium 138, potassium 4.3, chloride 101, CO2 22, BUN 16, creatinine 1.0.  LFTs within  normal.  WBC 5.4, hemoglobin 15.8, hematocrit 46.3, platelets 183.  Hemoglobin A1c 5.6.  CT scan abdomen/pelvis on 08/11/2021: IMPRESSION: Right inguinal hernia containing small bowel loop. No evidence of bowel obstruction.  Assessment and Plan: This is a 71 y.o. male with a right inguinal hernia containing small bowel.  - Discussed with the patient the findings on the CT scan.  Although the patient is overall asymptomatic in the right groin, my concern is that his hernia does contain a loop of small bowel.  Although on the CT scan there is no  evidence of any bowel obstruction, my worry would be that as his hernia progresses, it could potentially lead to that or become incarcerated.  Discussed with him that given this, I would recommend proceeding with surgical repair.  However we do have flexibility of timing based on the fact that he is otherwise asymptomatic in that area.  He does have to move to [facility], [place] and would rather do the surgery after all this is settled down.  I think that is reasonable as he would have an activity restriction of no heavy lifting or pushing for 6 weeks after surgery. - Discussed with him briefly the plan will be for a minimally invasive approach or robotic assisted surgery and during the surgery, would be able to evaluate the left groin to make sure there is no hernia in that area. - Patient will give [place] a call after he has settled down from his move so that we can see him and schedule him for surgery.  In the meantime, we will send for medical clearance for surgery.  I spent 25 minutes dedicated to the care of this patient on the date of this encounter to include pre-visit review of records, face-to-face time with the patient discussing diagnosis and management, and any post-visit coordination of care.   Melvyn Neth, [facility]

## 2021-09-01 NOTE — Patient Instructions (Addendum)
We will send Tyler Joe, FNP a medical clearance.   If you have any concerns or questions, please feel free to call our office.   Laparoscopic Inguinal Hernia Repair, Adult Laparoscopic inguinal hernia repair is a surgical procedure to repair a small, weak spot in the groin muscles that allows fat or intestines from inside the abdomen to bulge out (inguinal hernia). This procedure may be planned, or it may be an emergency procedure. During the procedure, tissue that has bulged out is moved back into place, and the opening in the groin muscles is repaired. This is done through three small incisions in the abdomen. A thin tube with a light and camera on the end (laparoscope) is used to help perform the procedure. Tell a health care provider about: Any allergies you have. All medicines you are taking, including vitamins, herbs, eye drops, creams, and over-the-counter medicines. Any problems you or family members have had with anesthetic medicines. Any blood disorders you have. Any surgeries you have had. Any medical conditions you have. Whether you are pregnant or may be pregnant. What are the risks? Generally, this is a safe procedure. However, problems may occur, including: Infection. Bleeding. Allergic reactions to medicines. Damage to nearby structures or organs. Testicle damage or long-term pain and swelling of the scrotum, in males. Inability to completely empty the bladder (urinary retention). Blood clots. A collection of fluid that builds up under the skin (seroma). The hernia coming back (recurrence). What happens before the procedure? Staying hydrated Follow instructions from your health care provider about hydration, which may include: Up to 2 hours before the procedure - you may continue to drink clear liquids, such as water, clear fruit juice, black coffee, and plain tea.  Eating and drinking restrictions Follow instructions from your health care provider about eating and  drinking, which may include: 8 hours before the procedure - stop eating heavy meals or foods, such as meat, fried foods, or fatty foods. 6 hours before the procedure - stop eating light meals or foods, such as toast or cereal. 6 hours before the procedure - stop drinking milk or drinks that contain milk. 2 hours before the procedure - stop drinking clear liquids. Medicines Ask your health care provider about: Changing or stopping your regular medicines. This is especially important if you are taking diabetes medicines or blood thinners. Taking medicines such as aspirin and ibuprofen. These medicines can thin your blood. Do not take these medicines unless your health care provider tells you to take them. Taking over-the-counter medicines, vitamins, herbs, and supplements. General instructions Do not use any products that contain nicotine or tobacco for at least 4 weeks before the procedure, if possible. These products include cigarettes, chewing tobacco, and vaping devices, such as e-cigarettes. If you need help quitting, ask your health care provider. Ask your health care provider: How your surgery site will be marked. What steps will be taken to help prevent infection. These steps may include: Removing hair at the surgery site. Washing skin with a germ-killing soap. Taking antibiotic medicine. Plan to have a responsible adult take you home from the hospital or clinic. Plan to have a responsible adult care for you for the time you are told after you leave the hospital or clinic. This is important. What happens during the procedure? An IV will be inserted into one of your veins. You will be given one or more of the following: A medicine to help you relax (sedative). A medicine to make you fall asleep (general  anesthetic). Three small incisions will be made in your abdomen. Your abdomen will be inflated with carbon dioxide gas to make the surgical area easier to see. A laparoscope and  surgical instruments will be inserted through the incisions. The laparoscope will send images of the inside of your abdomen to a monitor in the room. Tissue that is bulging through the hernia may be removed or moved back into place. The hernia opening will be closed with a sheet of surgical mesh. The surgical instruments and laparoscope will be removed. Your incisions will be closed with stitches (sutures) and adhesive strips. A bandage (dressing) will be placed over your incisions. The procedure may vary among health care providers and hospitals. What happens after the procedure? Your blood pressure, heart rate, breathing rate, and blood oxygen level will be monitored until you leave the hospital or clinic. You will be given pain medicine as needed. You may continue to receive medicines and fluids through an IV. The IV will be removed after you can drink fluids. You will be encouraged to get up and move around and to take deep breaths frequently. If you were given a sedative during the procedure, it can affect you for several hours. Do not drive or operate machinery until your health care provider says that it is safe. Summary Laparoscopic inguinal hernia repair is a surgical procedure to repair a small, weak spot in the groin muscles that allows fat or intestines from inside the abdomen to bulge out (inguinal hernia). This procedure is done through three small incisions in the abdomen. A thin tube with a light and camera on the end (laparoscope) is used to help perform the procedure. After the procedure, you will be encouraged to get up and move around and to take deep breaths frequently. This information is not intended to replace advice given to you by your health care provider. Make sure you discuss any questions you have with your health care provider. Document Revised: 04/12/2020 Document Reviewed: 04/12/2020 Elsevier Patient Education  2022 [employer].

## 2021-09-01 NOTE — Telephone Encounter (Signed)
Faxed medical clearance to Elise Payne, FNP at [phone]. 

## 2021-09-08 ENCOUNTER — Telehealth: Payer: Self-pay

## 2021-09-08 NOTE — Telephone Encounter (Signed)
Copied from [place] [phone]. Topic: General - Inquiry >> Sep 08, 2021 12:08 PM Tyler Gregory wrote: Patient returning a missed call but unclear of who called and why.

## 2021-09-11 NOTE — Telephone Encounter (Signed)
Had reached out to patient 3x  last week and left voicemails. I contacted patient back today to advise him that we had received clearance form from [facility], the only thing that still was needed for form to be complete was for patient to have and EKG. I scheduled patient appt this wednesday

## 2021-09-13 ENCOUNTER — Telehealth: Payer: Self-pay

## 2021-09-13 ENCOUNTER — Other Ambulatory Visit: Payer: Self-pay

## 2021-09-13 ENCOUNTER — Encounter: Payer: Self-pay | Admitting: Family Medicine

## 2021-09-13 ENCOUNTER — Ambulatory Visit (INDEPENDENT_AMBULATORY_CARE_PROVIDER_SITE_OTHER): Payer: PPO | Admitting: Family Medicine

## 2021-09-13 VITALS — BP 122/72 | HR 67 | Resp 16 | Wt 225.3 lb

## 2021-09-13 DIAGNOSIS — Z01818 Encounter for other preprocedural examination: Secondary | ICD-10-CM | POA: Diagnosis not present

## 2021-09-13 DIAGNOSIS — H9312 Tinnitus, left ear: Secondary | ICD-10-CM

## 2021-09-13 DIAGNOSIS — E669 Obesity, unspecified: Secondary | ICD-10-CM

## 2021-09-13 NOTE — Assessment & Plan Note (Signed)
Weight reduction with dietary change noted BMI remains >30 Continue heart healthy diet with statin use

## 2021-09-13 NOTE — Assessment & Plan Note (Signed)
Acute, no loss of hearing No medications known to cause Has been around a cat Has been moving more items in prep of move to [facility] No signs of infection on exam Slight irritation from self inflicted 'digging' with cotton tip swab cleansed with debrox in office

## 2021-09-13 NOTE — Patient Instructions (Signed)
DeBrox- OTC to assist with wax

## 2021-09-13 NOTE — Progress Notes (Signed)
Established patient visit   Patient: Tyler Gregory   DOB: [birth year]   71 y.o. Male  MRN: [id number] Visit Date: 09/13/2021  Today's healthcare provider: Gwyneth Sprout, FNP   Chief Complaint  Patient presents with   Tinnitus   Subjective    HPI HPI   Patient reports for the past month he has had ringing in L ear and states that he has had sinus like symptoms such as congestion and runny nose. Last edited by Gwyneth Sprout, FNP on 09/13/2021 10:31 AM.      Due for EKG for workup for hernia repair  Medications: Outpatient Medications Prior to Visit  Medication Sig   albuterol (VENTOLIN HFA) 108 (90 Base) MCG/ACT inhaler Inhale 2 puffs into the lungs every 6 (six) hours as needed for wheezing or shortness of breath.   atorvastatin (LIPITOR) 80 MG tablet Take 1 tablet (80 mg total) by mouth daily.   fluticasone (FLONASE) 50 MCG/ACT nasal spray Place 1 spray into both nostrils daily. As needed   No facility-administered medications prior to visit.    Review of Systems     Objective    BP 122/72    Pulse 67    Resp 16    Wt 225 lb 4.8 oz (102.2 kg)    SpO2 98%    BMI 30.56 kg/m    Physical Exam Vitals and nursing note reviewed.  Constitutional:      General: He is awake. He is not in acute distress.    Appearance: Normal appearance. He is well-developed and well-groomed. He is obese. He is not ill-appearing, toxic-appearing or diaphoretic.  HENT:     Head: Normocephalic and atraumatic.     Jaw: There is normal jaw occlusion. No trismus, tenderness, swelling or pain on movement.     Salivary Glands: Right salivary gland is not diffusely enlarged or tender. Left salivary gland is not diffusely enlarged or tender.     Right Ear: Hearing, tympanic membrane, ear canal and external ear normal. There is no impacted cerumen.     Left Ear: Hearing, tympanic membrane, ear canal and external ear normal. There is no impacted cerumen.     Ears:     Comments: Complaints of L ear  tinnitus    Nose: Nose normal. No congestion or rhinorrhea.     Right Turbinates: Not enlarged, swollen or pale.     Left Turbinates: Not enlarged, swollen or pale.     Right Sinus: No maxillary sinus tenderness or frontal sinus tenderness.     Left Sinus: No maxillary sinus tenderness or frontal sinus tenderness.     Mouth/Throat:     Lips: Pink.     Mouth: Mucous membranes are moist. No injury, lacerations, oral lesions or angioedema.     Pharynx: Oropharynx is clear. Uvula midline. No pharyngeal swelling, oropharyngeal exudate or posterior oropharyngeal erythema.     Tonsils: No tonsillar exudate or tonsillar abscesses.  Eyes:     General: Lids are normal. Vision grossly intact. Gaze aligned appropriately.        Right eye: No discharge.        Left eye: No discharge.     Extraocular Movements: Extraocular movements intact.     Conjunctiva/sclera: Conjunctivae normal.     Pupils: Pupils are equal, round, and reactive to light.  Neck:     Thyroid: No thyroid mass, thyromegaly or thyroid tenderness.     Vascular: No carotid bruit.  Trachea: Trachea normal. No tracheal tenderness.  Cardiovascular:     Rate and Rhythm: Normal rate and regular rhythm.     Pulses: Normal pulses.          Carotid pulses are 2+ on the right side and 2+ on the left side.      Radial pulses are 2+ on the right side and 2+ on the left side.       Femoral pulses are 2+ on the right side and 2+ on the left side.      Popliteal pulses are 2+ on the right side and 2+ on the left side.       Dorsalis pedis pulses are 2+ on the right side and 2+ on the left side.       Posterior tibial pulses are 2+ on the right side and 2+ on the left side.     Heart sounds: Normal heart sounds, S1 normal and S2 normal. No murmur heard.   No friction rub. No gallop.  Pulmonary:     Effort: Pulmonary effort is normal. No respiratory distress.     Breath sounds: Normal breath sounds and air entry. No stridor. No wheezing,  rhonchi or rales.  Chest:     Chest wall: No tenderness.  Abdominal:     General: Abdomen is flat. Bowel sounds are normal. There is no distension.     Palpations: Abdomen is soft. There is no mass.     Tenderness: There is no abdominal tenderness. There is no guarding or rebound.     Hernia: No hernia is present.  Genitourinary:    Comments: Exam deferred; denies complaints Musculoskeletal:        General: No swelling, tenderness, deformity or signs of injury. Normal range of motion.     Cervical back: Normal range of motion and neck supple. No rigidity or tenderness.     Right lower leg: No edema.     Left lower leg: No edema.  Lymphadenopathy:     Cervical: No cervical adenopathy.     Right cervical: No superficial, deep or posterior cervical adenopathy.    Left cervical: No superficial, deep or posterior cervical adenopathy.  Skin:    General: Skin is warm and dry.     Capillary Refill: Capillary refill takes less than 2 seconds.     Coloration: Skin is not jaundiced or pale.     Findings: No bruising, erythema, lesion or rash.  Neurological:     General: No focal deficit present.     Mental Status: He is alert and oriented to person, place, and time. Mental status is at baseline.     GCS: GCS eye subscore is 4. GCS verbal subscore is 5. GCS motor subscore is 6.     Sensory: Sensation is intact. No sensory deficit.     Motor: Motor function is intact. No weakness.     Coordination: Coordination is intact.     Gait: Gait is intact.  Psychiatric:        Attention and Perception: Attention and perception normal.        Mood and Affect: Mood and affect normal.        Speech: Speech normal.        Behavior: Behavior normal. Behavior is cooperative.        Thought Content: Thought content normal.        Cognition and Memory: Cognition normal.        Judgment: Judgment normal.  No results found for any visits on 09/13/21.  Assessment & Plan     Problem List Items  Addressed This Visit       Other   Pre-op testing - Primary    EKG completed; normal SR Plans for hernia repair when able to in light of move of personal/business to [facility]      Relevant Orders   EKG 12-Lead (Completed)   Tinnitus of left ear    Acute, no loss of hearing No medications known to cause Has been around a cat Has been moving more items in prep of move to [facility] No signs of infection on exam Slight irritation from self inflicted 'digging' with cotton tip swab cleansed with debrox in office       Obesity (BMI 30-39.9)    Weight reduction with dietary change noted BMI remains >30 Continue heart healthy diet with statin use        Return if symptoms worsen or fail to improve.      Vonna Kotyk, FNP, have reviewed all documentation for this visit. The documentation on 09/13/21 for the exam, diagnosis, procedures, and orders are all accurate and complete.    Gwyneth Sprout, [place] [phone] (phone) [phone] (fax) Patient seen and examined by Tally Joe,  FNP note scribed by [employer], [facility]

## 2021-09-13 NOTE — Telephone Encounter (Signed)
Medical clearance received from [facility] FNP-patient is optimized for surgery -low risk.

## 2021-09-13 NOTE — Assessment & Plan Note (Signed)
EKG completed; normal SR Plans for hernia repair when able to in light of move of personal/business to [facility]

## 2021-11-01 ENCOUNTER — Ambulatory Visit: Payer: PPO | Admitting: Gastroenterology

## 2021-11-01 ENCOUNTER — Encounter: Payer: Self-pay | Admitting: Gastroenterology

## 2021-11-14 ENCOUNTER — Ambulatory Visit: Payer: PPO | Admitting: Family Medicine

## 2021-11-14 NOTE — Progress Notes (Deleted)
?  ? ? ?  Established patient visit ? ? ?Patient: Tyler Gregory   DOB: 07-31-1951   71 y.o. Male  MRN: [id number] ?Visit Date: 11/14/2021 ? ?Today's healthcare provider: Gwyneth Sprout, FNP  ? ?No chief complaint on file. ? ?Subjective  ?  ?HPI  ?*** ? ?Medications: ?Outpatient Medications Prior to Visit  ?Medication Sig  ? albuterol (VENTOLIN HFA) 108 (90 Base) MCG/ACT inhaler Inhale 2 puffs into the lungs every 6 (six) hours as needed for wheezing or shortness of breath.  ? atorvastatin (LIPITOR) 80 MG tablet Take 1 tablet (80 mg total) by mouth daily.  ? fluticasone (FLONASE) 50 MCG/ACT nasal spray Place 1 spray into both nostrils daily. As needed  ? ?No facility-administered medications prior to visit.  ? ? ?Review of Systems ? ?{Labs  Heme  Chem  Endocrine  Serology  Results Review (optional):23779} ?  Objective  ?  ?There were no vitals taken for this visit. ?{Show previous vital signs (optional):23777} ? ?Physical Exam  ?*** ? ?No results found for any visits on 11/14/21. ? Assessment & Plan  ?  ? ?*** ? ?No follow-ups on file.  ?   ? ?{provider attestation***:1} ? ? ?Gwyneth Sprout, FNP  ?[place] ?[phone] (phone) ?[phone] (fax) ? ?[facility] ?

## 2022-04-27 IMAGING — CT CT ABD-PELV W/ CM
2 of 5 series · 16 of 46 positions shown, 18 images · IV contrast (omnipaque)
Comparison: None.

CLINICAL DATA: Abdominal pain, acute, nonlocalized Abdominal pain.
Intermittent right upper quadrant and lower abdominal pain.

EXAM:
CT ABDOMEN AND PELVIS WITH CONTRAST
TECHNIQUE: Multidetector CT imaging of the abdomen and pelvis was performed
using the standard protocol following bolus administration of
intravenous contrast.
CONTRAST:  100mL OMNIPAQUE IOHEXOL 350 MG/ML SOLN

[Series 2: abd pelvis 5.00 · axial · 0.78mm/px · z∈[-1587,-1147]mm · 13 of 100 slices shown, 15 images]
[im 6/100  soft-tissue]
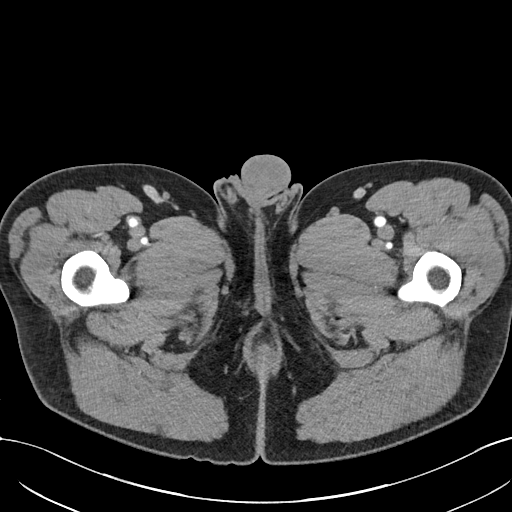
[im 6/100  bone]
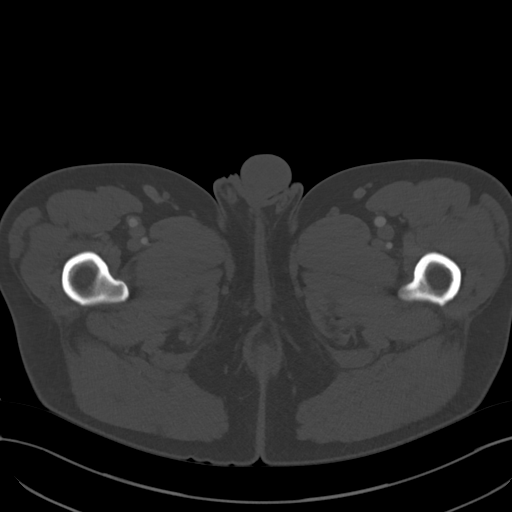
[im 12/100  soft-tissue]
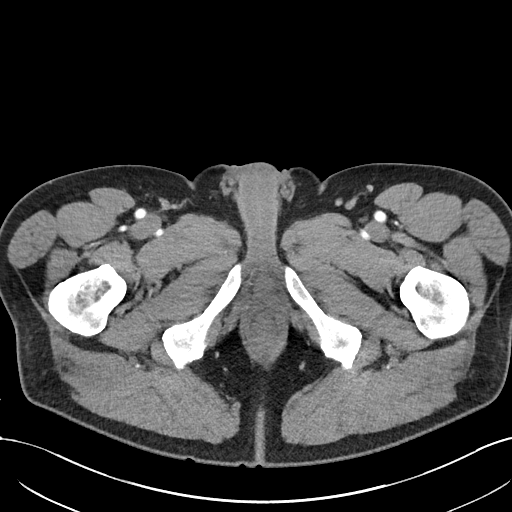
[im 23/100  soft-tissue]
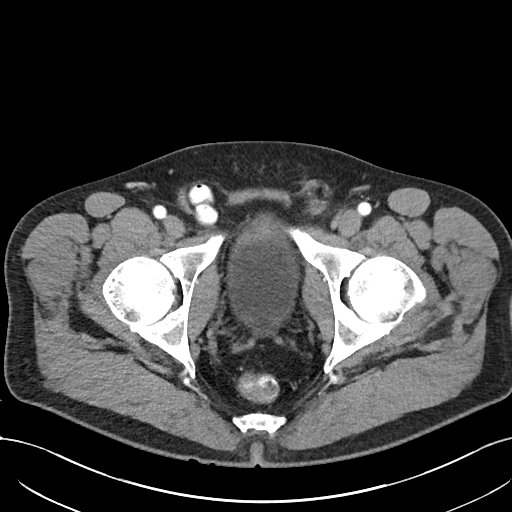
[im 28/100  soft-tissue]
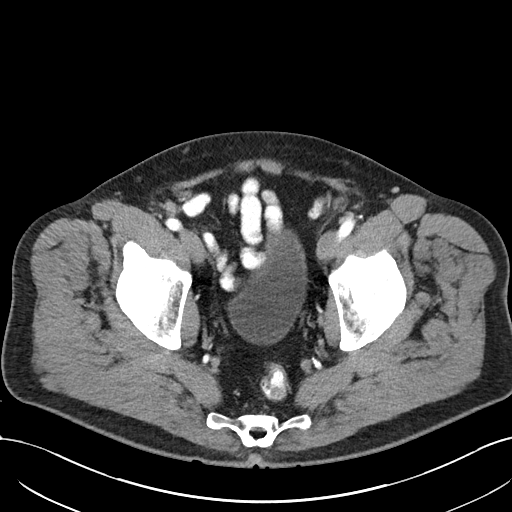
[im 34/100  soft-tissue]
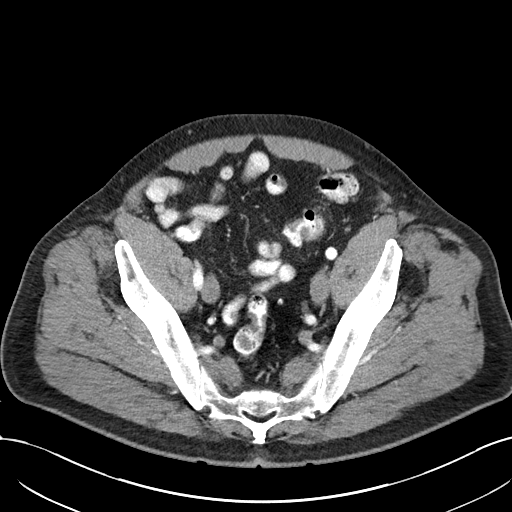
[im 45/100  soft-tissue]
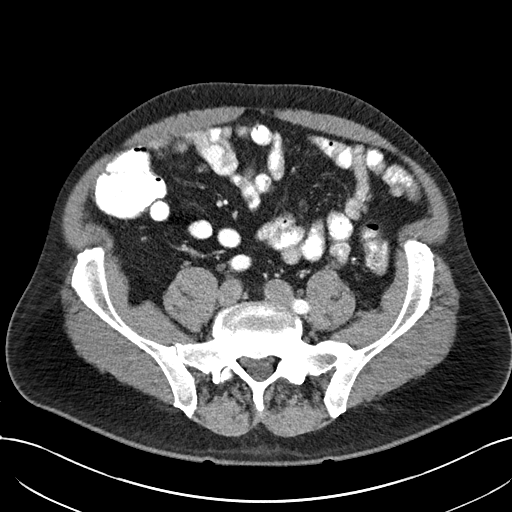
[im 50/100  soft-tissue]
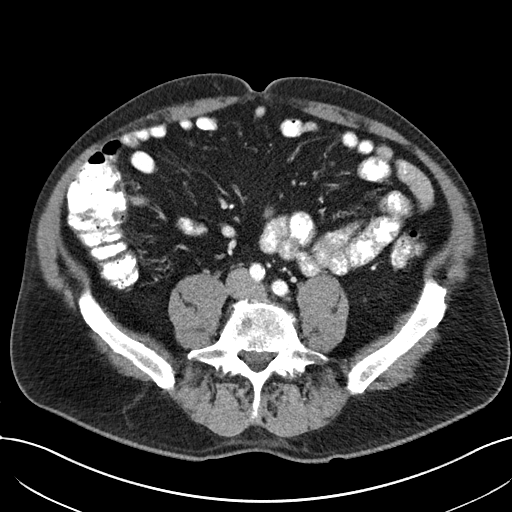
[im 56/100  soft-tissue]
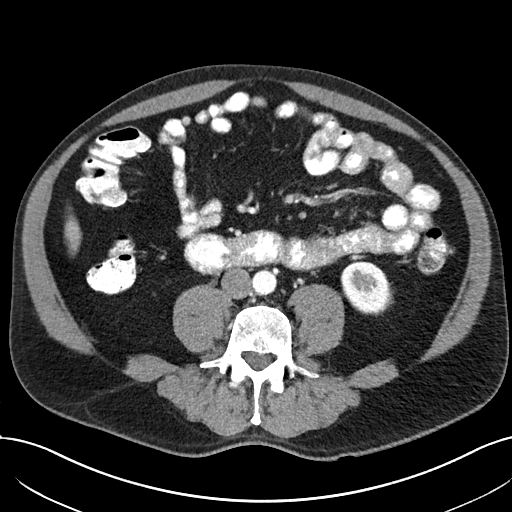
[im 67/100  soft-tissue]
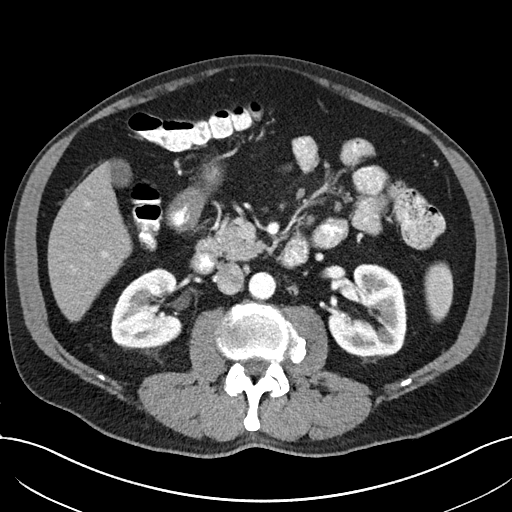
[im 67/100  bone]
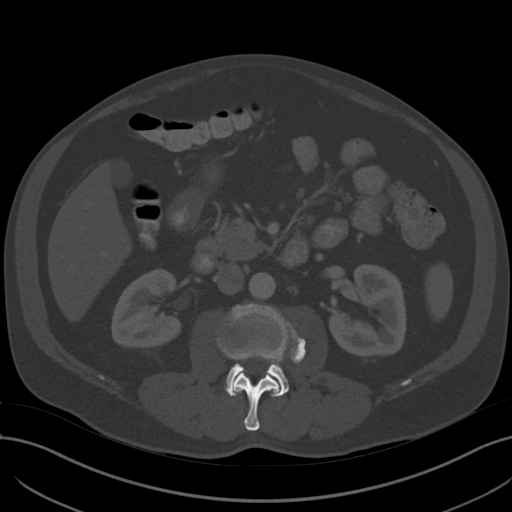
[im 72/100  soft-tissue]
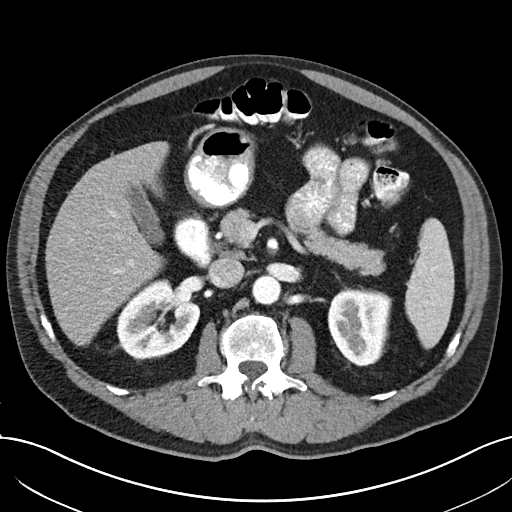
[im 78/100  soft-tissue]
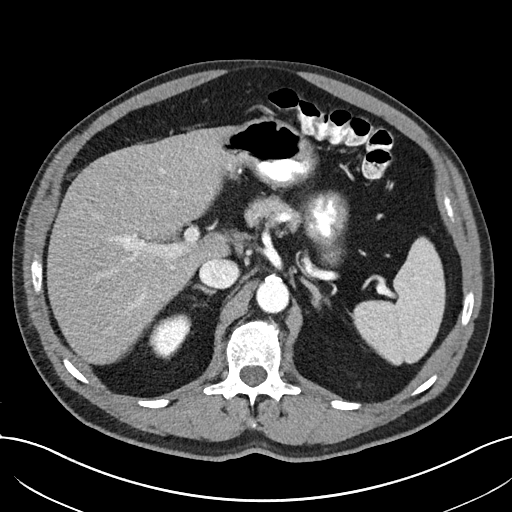
[im 89/100  soft-tissue]
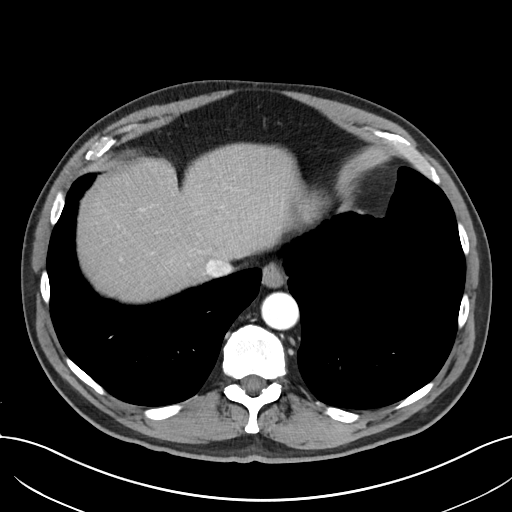
[im 94/100  soft-tissue]
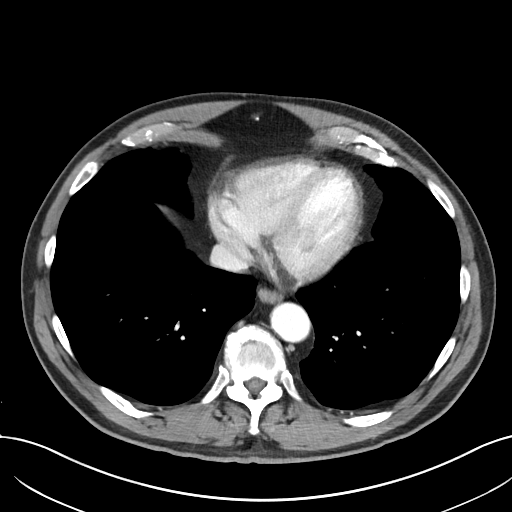

[Series 4: coronals abd pelvis 2.00 cor · coronal · 0.78mm/px · 3 of 164 slices shown]
[im 55/164  soft-tissue]
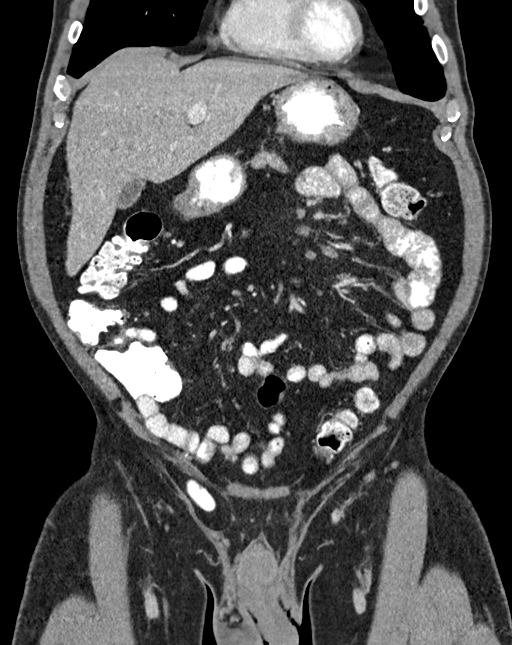
[im 73/164  soft-tissue]
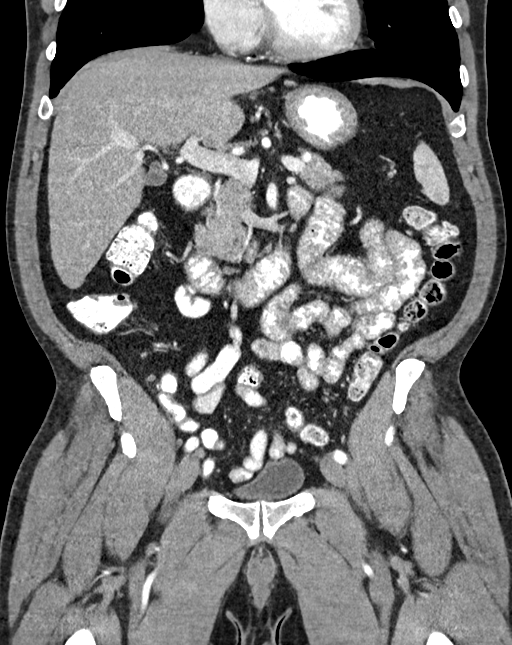
[im 91/164  soft-tissue]
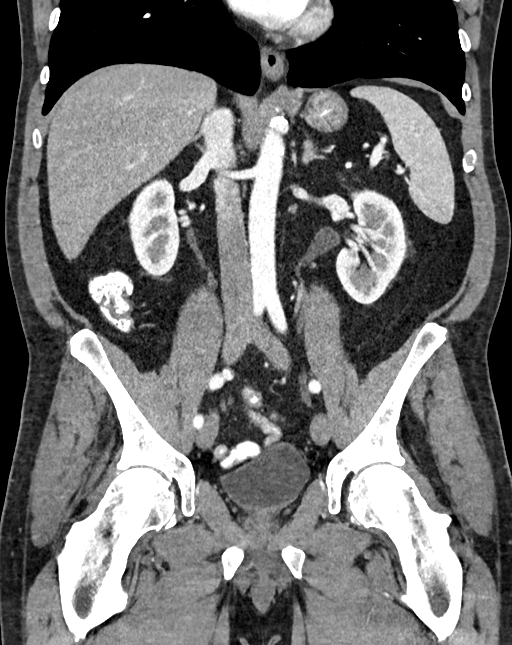

[16 of 46 positions shown; findings below may reference images not displayed]

FINDINGS: Lower chest: No acute abnormality

Hepatobiliary: No focal hepatic abnormality. Gallbladder
unremarkable.

Pancreas: No focal abnormality or ductal dilatation.

Spleen: No focal abnormality.  Normal size.

Adrenals/Urinary Tract: No adrenal abnormality. No focal renal
abnormality. No stones or hydronephrosis. Urinary bladder is
unremarkable.

Stomach/Bowel: Normal appendix. Stomach, large and small bowel
grossly unremarkable.

Vascular/Lymphatic: No evidence of aneurysm or adenopathy.

Reproductive: No visible focal abnormality.

Other: No free fluid or free air.

Musculoskeletal: Right inguinal hernia containing fat and a small
bowel loop. No evidence of bowel obstruction. No acute bony
abnormality.
IMPRESSION: Right inguinal hernia containing small bowel loop. No evidence of
bowel obstruction.

## 2022-07-25 ENCOUNTER — Telehealth: Payer: Self-pay | Admitting: Family Medicine

## 2022-07-25 NOTE — Telephone Encounter (Signed)
I called patient to schedule AWV.  Patient said he has moved to [facility] and will be transferring to a PCP in [place]. Please remove PCP.
